# Patient Record
Sex: Female | Born: 1960 | Race: White | Hispanic: No | Smoking: Current every day smoker
Health system: Southern US, Community
[De-identification: ages and names within clinical notes are randomized; demographics above are authoritative.]

## PROBLEM LIST (undated history)

## (undated) DIAGNOSIS — R739 Hyperglycemia, unspecified: Secondary | ICD-10-CM

## (undated) DIAGNOSIS — G4700 Insomnia, unspecified: Secondary | ICD-10-CM

## (undated) DIAGNOSIS — F329 Major depressive disorder, single episode, unspecified: Secondary | ICD-10-CM

## (undated) DIAGNOSIS — E7800 Pure hypercholesterolemia, unspecified: Secondary | ICD-10-CM

## (undated) DIAGNOSIS — K5090 Crohn's disease, unspecified, without complications: Secondary | ICD-10-CM

## (undated) DIAGNOSIS — M5430 Sciatica, unspecified side: Secondary | ICD-10-CM

## (undated) DIAGNOSIS — K745 Biliary cirrhosis, unspecified: Secondary | ICD-10-CM

## (undated) DIAGNOSIS — H9190 Unspecified hearing loss, unspecified ear: Secondary | ICD-10-CM

## (undated) DIAGNOSIS — F411 Generalized anxiety disorder: Secondary | ICD-10-CM

## (undated) DIAGNOSIS — F900 Attention-deficit hyperactivity disorder, predominantly inattentive type: Secondary | ICD-10-CM

## (undated) DIAGNOSIS — I10 Essential (primary) hypertension: Secondary | ICD-10-CM

## (undated) DIAGNOSIS — R7303 Prediabetes: Secondary | ICD-10-CM

## (undated) DIAGNOSIS — K529 Noninfective gastroenteritis and colitis, unspecified: Secondary | ICD-10-CM

## (undated) DIAGNOSIS — E559 Vitamin D deficiency, unspecified: Secondary | ICD-10-CM

## (undated) DIAGNOSIS — Z720 Tobacco use: Secondary | ICD-10-CM

## (undated) DIAGNOSIS — K743 Primary biliary cirrhosis: Secondary | ICD-10-CM

## (undated) HISTORY — DX: Vitamin D deficiency, unspecified: E55.9

## (undated) HISTORY — DX: Insomnia, unspecified: G47.00

## (undated) HISTORY — DX: Sciatica, unspecified side: M54.30

## (undated) HISTORY — DX: Hyperglycemia, unspecified: R73.9

## (undated) HISTORY — DX: Unspecified hearing loss, unspecified ear: H91.90

## (undated) HISTORY — DX: Tobacco use: Z72.0

## (undated) HISTORY — DX: Noninfective gastroenteritis and colitis, unspecified: K52.9

## (undated) HISTORY — DX: Attention-deficit hyperactivity disorder, predominantly inattentive type: F90.0

## (undated) HISTORY — DX: Primary biliary cirrhosis: K74.3

## (undated) HISTORY — PX: KNEE SURGERY: SHX244

## (undated) HISTORY — DX: Generalized anxiety disorder: F41.1

## (undated) HISTORY — DX: Prediabetes: R73.03

## (undated) HISTORY — PX: TONSILLECTOMY: SUR1361

## (undated) HISTORY — DX: Major depressive disorder, single episode, unspecified: F32.9

## (undated) HISTORY — DX: Essential (primary) hypertension: I10

## (undated) HISTORY — DX: Pure hypercholesterolemia, unspecified: E78.00

---

## 2006-01-14 ENCOUNTER — Other Ambulatory Visit: Admission: RE | Admit: 2006-01-14 | Discharge: 2006-01-14 | Payer: Self-pay | Admitting: Internal Medicine

## 2007-03-20 ENCOUNTER — Other Ambulatory Visit: Admission: RE | Admit: 2007-03-20 | Discharge: 2007-03-20 | Payer: Self-pay | Admitting: Obstetrics and Gynecology

## 2009-09-18 ENCOUNTER — Encounter: Admission: RE | Admit: 2009-09-18 | Discharge: 2009-09-18 | Payer: Self-pay | Admitting: Family Medicine

## 2012-03-27 ENCOUNTER — Other Ambulatory Visit: Payer: Self-pay | Admitting: Family Medicine

## 2012-03-27 ENCOUNTER — Ambulatory Visit
Admission: RE | Admit: 2012-03-27 | Discharge: 2012-03-27 | Disposition: A | Discharge status: Home / Self Care | Payer: Medicare HMO | Source: Ambulatory Visit | Attending: Family Medicine | Admitting: Family Medicine

## 2012-03-27 DIAGNOSIS — M79609 Pain in unspecified limb: Secondary | ICD-10-CM

## 2012-09-10 ENCOUNTER — Observation Stay (HOSPITAL_COMMUNITY)
Admission: EM | Admit: 2012-09-10 | Discharge: 2012-09-11 | Disposition: A | Discharge status: Home / Self Care | Payer: Managed Care, Other (non HMO) | Attending: General Surgery | Admitting: General Surgery

## 2012-09-10 ENCOUNTER — Emergency Department (HOSPITAL_COMMUNITY): Payer: Managed Care, Other (non HMO) | Admitting: Certified Registered Nurse Anesthetist

## 2012-09-10 ENCOUNTER — Encounter (HOSPITAL_COMMUNITY)
Admission: EM | Disposition: A | Discharge status: Home / Self Care | Payer: Self-pay | Source: Home / Self Care | Attending: Emergency Medicine

## 2012-09-10 ENCOUNTER — Encounter (HOSPITAL_COMMUNITY): Payer: Self-pay | Admitting: Certified Registered Nurse Anesthetist

## 2012-09-10 ENCOUNTER — Encounter (HOSPITAL_COMMUNITY): Payer: Self-pay

## 2012-09-10 ENCOUNTER — Emergency Department (HOSPITAL_COMMUNITY): Payer: Managed Care, Other (non HMO)

## 2012-09-10 DIAGNOSIS — K3580 Unspecified acute appendicitis: Secondary | ICD-10-CM

## 2012-09-10 DIAGNOSIS — K745 Biliary cirrhosis, unspecified: Secondary | ICD-10-CM | POA: Insufficient documentation

## 2012-09-10 DIAGNOSIS — I10 Essential (primary) hypertension: Secondary | ICD-10-CM | POA: Insufficient documentation

## 2012-09-10 DIAGNOSIS — K37 Unspecified appendicitis: Secondary | ICD-10-CM

## 2012-09-10 DIAGNOSIS — E7800 Pure hypercholesterolemia, unspecified: Secondary | ICD-10-CM | POA: Insufficient documentation

## 2012-09-10 DIAGNOSIS — K5090 Crohn's disease, unspecified, without complications: Secondary | ICD-10-CM | POA: Insufficient documentation

## 2012-09-10 DIAGNOSIS — N390 Urinary tract infection, site not specified: Secondary | ICD-10-CM

## 2012-09-10 DIAGNOSIS — Z79899 Other long term (current) drug therapy: Secondary | ICD-10-CM | POA: Insufficient documentation

## 2012-09-10 HISTORY — DX: Crohn's disease, unspecified, without complications: K50.90

## 2012-09-10 HISTORY — DX: Biliary cirrhosis, unspecified: K74.5

## 2012-09-10 HISTORY — DX: Essential (primary) hypertension: I10

## 2012-09-10 HISTORY — DX: Pure hypercholesterolemia, unspecified: E78.00

## 2012-09-10 HISTORY — PX: LAPAROSCOPIC APPENDECTOMY: SHX408

## 2012-09-10 LAB — URINALYSIS, ROUTINE W REFLEX MICROSCOPIC
Bilirubin Urine: NEGATIVE
Glucose, UA: NEGATIVE mg/dL
Ketones, ur: NEGATIVE mg/dL
Nitrite: POSITIVE — AB
Protein, ur: NEGATIVE mg/dL
Specific Gravity, Urine: 1.018 (ref 1.005–1.030)
Urobilinogen, UA: 0.2 mg/dL (ref 0.0–1.0)
pH: 6 (ref 5.0–8.0)

## 2012-09-10 LAB — LIPASE, BLOOD: Lipase: 16 U/L (ref 11–59)

## 2012-09-10 LAB — COMPREHENSIVE METABOLIC PANEL
ALT: 14 U/L (ref 0–35)
AST: 18 U/L (ref 0–37)
Albumin: 3.7 g/dL (ref 3.5–5.2)
Alkaline Phosphatase: 144 U/L — ABNORMAL HIGH (ref 39–117)
BUN: 10 mg/dL (ref 6–23)
CO2: 25 mEq/L (ref 19–32)
Calcium: 9.7 mg/dL (ref 8.4–10.5)
Chloride: 101 mEq/L (ref 96–112)
Creatinine, Ser: 0.53 mg/dL (ref 0.50–1.10)
GFR calc Af Amer: >90 mL/min (ref >90)
GFR calc non Af Amer: >90 mL/min (ref >90)
Glucose, Bld: 113 mg/dL — ABNORMAL HIGH (ref 70–99)
Potassium: 3.9 mEq/L (ref 3.5–5.1)
Sodium: 136 mEq/L (ref 135–145)
Total Bilirubin: 0.4 mg/dL (ref 0.3–1.2)
Total Protein: 7.4 g/dL (ref 6.0–8.3)

## 2012-09-10 LAB — TYPE AND SCREEN
ABO/RH(D): A POS
Antibody Screen: NEGATIVE

## 2012-09-10 LAB — CBC WITH DIFFERENTIAL/PLATELET
Basophils Absolute: 0 10*3/uL (ref 0.0–0.1)
Basophils Relative: 0 % (ref 0–1)
Eosinophils Absolute: 0.3 10*3/uL (ref 0.0–0.7)
Eosinophils Relative: 2 % (ref 0–5)
HCT: 39.6 % (ref 36.0–46.0)
Hemoglobin: 13.6 g/dL (ref 12.0–15.0)
Lymphocytes Relative: 15 % (ref 12–46)
Lymphs Abs: 2.3 10*3/uL (ref 0.7–4.0)
MCH: 31.6 pg (ref 26.0–34.0)
MCHC: 34.3 g/dL (ref 30.0–36.0)
MCV: 92.1 fL (ref 78.0–100.0)
Monocytes Absolute: 0.8 10*3/uL (ref 0.1–1.0)
Monocytes Relative: 6 % (ref 3–12)
Neutro Abs: 11.3 10*3/uL — ABNORMAL HIGH (ref 1.7–7.7)
Neutrophils Relative %: 77 % (ref 43–77)
Platelets: 275 10*3/uL (ref 150–400)
RBC: 4.3 MIL/uL (ref 3.87–5.11)
RDW: 13.6 % (ref 11.5–15.5)
WBC: 14.8 10*3/uL — ABNORMAL HIGH (ref 4.0–10.5)

## 2012-09-10 LAB — URINE MICROSCOPIC-ADD ON

## 2012-09-10 LAB — APTT: aPTT: 28 seconds (ref 24–37)

## 2012-09-10 LAB — PROTIME-INR
INR: 0.97 (ref 0.00–1.49)
Prothrombin Time: 12.8 seconds (ref 11.6–15.2)

## 2012-09-10 LAB — ABO/RH: ABO/RH(D): A POS

## 2012-09-10 SURGERY — APPENDECTOMY, LAPAROSCOPIC
Anesthesia: General | Site: Abdomen | Wound class: Clean Contaminated

## 2012-09-10 MED ORDER — SUCCINYLCHOLINE CHLORIDE 20 MG/ML IJ SOLN
INTRAMUSCULAR | Status: DC | PRN
  Administered 2012-09-10: 100 mg via INTRAVENOUS

## 2012-09-10 MED ORDER — LIDOCAINE HCL (CARDIAC) 20 MG/ML IV SOLN
INTRAVENOUS | Status: DC | PRN
  Administered 2012-09-10: 50 mg via INTRAVENOUS

## 2012-09-10 MED ORDER — NEOSTIGMINE METHYLSULFATE 1 MG/ML IJ SOLN
INTRAMUSCULAR | Status: DC | PRN
  Administered 2012-09-10: 5 mg via INTRAVENOUS

## 2012-09-10 MED ORDER — LACTATED RINGERS IV SOLN: INTRAVENOUS | Status: DC

## 2012-09-10 MED ORDER — KCL IN DEXTROSE-NACL 20-5-0.45 MEQ/L-%-% IV SOLN
INTRAVENOUS | Status: AC
  Filled 2012-09-10: qty 1000

## 2012-09-10 MED ORDER — BUPIVACAINE-EPINEPHRINE 0.25% -1:200000 IJ SOLN
INTRAMUSCULAR | Status: DC | PRN
  Administered 2012-09-10: 20 mL

## 2012-09-10 MED ORDER — SODIUM CHLORIDE 0.9 % IV SOLN
1.0000 g | INTRAVENOUS | Status: DC
  Administered 2012-09-10 – 2012-09-11 (×2): 1 g via INTRAVENOUS
  Filled 2012-09-10: qty 1

## 2012-09-10 MED ORDER — ONDANSETRON HCL 4 MG/2ML IJ SOLN
4.0000 mg | Freq: Once | INTRAMUSCULAR | Status: AC
  Administered 2012-09-10: 4 mg via INTRAVENOUS
  Filled 2012-09-10: qty 2

## 2012-09-10 MED ORDER — MORPHINE SULFATE 2 MG/ML IJ SOLN
1.0000 mg | INTRAMUSCULAR | Status: DC | PRN
  Administered 2012-09-10 (×3): 1 mg via INTRAVENOUS
  Filled 2012-09-10 (×4): qty 1

## 2012-09-10 MED ORDER — NEBIVOLOL HCL 10 MG PO TABS
10.0000 mg | ORAL_TABLET | Freq: Every day | ORAL | Status: DC
  Administered 2012-09-10: 10 mg via ORAL
  Filled 2012-09-10 (×2): qty 1

## 2012-09-10 MED ORDER — ACETAMINOPHEN 10 MG/ML IV SOLN
INTRAVENOUS | Status: DC | PRN
  Administered 2012-09-10: 1000 mg via INTRAVENOUS

## 2012-09-10 MED ORDER — ONDANSETRON HCL 4 MG/2ML IJ SOLN
4.0000 mg | Freq: Four times a day (QID) | INTRAMUSCULAR | Status: DC | PRN
  Administered 2012-09-10: 4 mg via INTRAVENOUS
  Filled 2012-09-10: qty 2

## 2012-09-10 MED ORDER — FENTANYL CITRATE 0.05 MG/ML IJ SOLN: 25.0000 ug | INTRAMUSCULAR | Status: DC | PRN

## 2012-09-10 MED ORDER — HEPARIN SODIUM (PORCINE) 5000 UNIT/ML IJ SOLN
5000.0000 [IU] | Freq: Three times a day (TID) | INTRAMUSCULAR | Status: DC
  Administered 2012-09-10 (×2): 5000 [IU] via SUBCUTANEOUS
  Filled 2012-09-10 (×6): qty 1

## 2012-09-10 MED ORDER — MORPHINE SULFATE 4 MG/ML IJ SOLN
4.0000 mg | Freq: Once | INTRAMUSCULAR | Status: AC
  Administered 2012-09-10: 4 mg via INTRAVENOUS
  Filled 2012-09-10: qty 1

## 2012-09-10 MED ORDER — LACTATED RINGERS IV SOLN
INTRAVENOUS | Status: DC | PRN
  Administered 2012-09-10 (×3): via INTRAVENOUS

## 2012-09-10 MED ORDER — ATORVASTATIN CALCIUM 10 MG PO TABS
5.0000 mg | ORAL_TABLET | Freq: Every day | ORAL | Status: DC
  Administered 2012-09-10: 5 mg via ORAL
  Filled 2012-09-10 (×2): qty 0.5

## 2012-09-10 MED ORDER — SODIUM CHLORIDE 0.9 % IV BOLUS (SEPSIS)
1000.0000 mL | Freq: Once | INTRAVENOUS | Status: AC
  Administered 2012-09-10: 1000 mL via INTRAVENOUS

## 2012-09-10 MED ORDER — ALPRAZOLAM 0.5 MG PO TABS
0.5000 mg | ORAL_TABLET | Freq: Every evening | ORAL | Status: DC | PRN
  Administered 2012-09-10: 0.5 mg via ORAL
  Filled 2012-09-10: qty 1

## 2012-09-10 MED ORDER — HYDROMORPHONE HCL PF 1 MG/ML IJ SOLN
1.0000 mg | Freq: Once | INTRAMUSCULAR | Status: DC
  Filled 2012-09-10: qty 1

## 2012-09-10 MED ORDER — FENTANYL CITRATE 0.05 MG/ML IJ SOLN
INTRAMUSCULAR | Status: DC | PRN
  Administered 2012-09-10: 100 ug via INTRAVENOUS
  Administered 2012-09-10 (×2): 50 ug via INTRAVENOUS
  Administered 2012-09-10: 100 ug via INTRAVENOUS

## 2012-09-10 MED ORDER — OXYCODONE-ACETAMINOPHEN 5-325 MG PO TABS
1.0000 | ORAL_TABLET | ORAL | Status: DC | PRN
  Administered 2012-09-10 (×2): 1 via ORAL
  Administered 2012-09-11 (×2): 2 via ORAL
  Filled 2012-09-10: qty 2
  Filled 2012-09-10: qty 1
  Filled 2012-09-10: qty 2
  Filled 2012-09-10: qty 1

## 2012-09-10 MED ORDER — ERTAPENEM SODIUM 1 G IJ SOLR
INTRAMUSCULAR | Status: AC
  Filled 2012-09-10: qty 1

## 2012-09-10 MED ORDER — ACETAMINOPHEN 10 MG/ML IV SOLN
INTRAVENOUS | Status: AC
  Filled 2012-09-10: qty 100

## 2012-09-10 MED ORDER — PROPOFOL 10 MG/ML IV BOLUS
INTRAVENOUS | Status: DC | PRN
  Administered 2012-09-10: 200 mg via INTRAVENOUS

## 2012-09-10 MED ORDER — BUPIVACAINE-EPINEPHRINE PF 0.25-1:200000 % IJ SOLN
INTRAMUSCULAR | Status: AC
  Filled 2012-09-10: qty 30

## 2012-09-10 MED ORDER — ONDANSETRON HCL 4 MG/2ML IJ SOLN
INTRAMUSCULAR | Status: DC | PRN
  Administered 2012-09-10: 4 mg via INTRAVENOUS

## 2012-09-10 MED ORDER — DESVENLAFAXINE SUCCINATE ER 50 MG PO TB24
50.0000 mg | ORAL_TABLET | Freq: Every day | ORAL | Status: DC
Start: 2012-09-10 — End: 2012-09-11
  Administered 2012-09-10: 50 mg via ORAL
  Filled 2012-09-10 (×3): qty 1

## 2012-09-10 MED ORDER — GLYCOPYRROLATE 0.2 MG/ML IJ SOLN
INTRAMUSCULAR | Status: DC | PRN
  Administered 2012-09-10: 0.6 mg via INTRAVENOUS

## 2012-09-10 MED ORDER — ROCURONIUM BROMIDE 100 MG/10ML IV SOLN
INTRAVENOUS | Status: DC | PRN
  Administered 2012-09-10: 10 mg via INTRAVENOUS
  Administered 2012-09-10: 40 mg via INTRAVENOUS
  Administered 2012-09-10: 10 mg via INTRAVENOUS

## 2012-09-10 MED ORDER — KCL IN DEXTROSE-NACL 20-5-0.45 MEQ/L-%-% IV SOLN
INTRAVENOUS | Status: DC
  Administered 2012-09-10 (×2): via INTRAVENOUS
  Filled 2012-09-10 (×3): qty 1000

## 2012-09-10 MED ORDER — LACTATED RINGERS IR SOLN
Status: DC | PRN
  Administered 2012-09-10: 1

## 2012-09-10 MED ORDER — SODIUM CHLORIDE 0.9 % IV SOLN: INTRAVENOUS | Status: DC

## 2012-09-10 SURGICAL SUPPLY — 38 items
APPLIER CLIP ROT 10 11.4 M/L (STAPLE) ×2
BENZOIN TINCTURE PRP APPL 2/3 (GAUZE/BANDAGES/DRESSINGS) ×2 IMPLANT
CANISTER SUCTION 2500CC (MISCELLANEOUS) ×2 IMPLANT
CLIP APPLIE ROT 10 11.4 M/L (STAPLE) ×1 IMPLANT
CLOTH BEACON ORANGE TIMEOUT ST (SAFETY) ×2 IMPLANT
COVER SURGICAL LIGHT HANDLE (MISCELLANEOUS) ×2 IMPLANT
CUTTER FLEX LINEAR 45M (STAPLE) ×2 IMPLANT
DECANTER SPIKE VIAL GLASS SM (MISCELLANEOUS) ×2 IMPLANT
DERMABOND ADVANCED (GAUZE/BANDAGES/DRESSINGS) ×1
DERMABOND ADVANCED .7 DNX12 (GAUZE/BANDAGES/DRESSINGS) ×1 IMPLANT
DRAPE LAPAROSCOPIC ABDOMINAL (DRAPES) ×2 IMPLANT
ELECT REM PT RETURN 9FT ADLT (ELECTROSURGICAL) ×2
ELECTRODE REM PT RTRN 9FT ADLT (ELECTROSURGICAL) ×1 IMPLANT
ENDOLOOP SUT PDS II  0 18 (SUTURE) ×1
ENDOLOOP SUT PDS II 0 18 (SUTURE) ×1 IMPLANT
GLOVE BIOGEL M 8.0 STRL (GLOVE) ×2 IMPLANT
GLOVE BIOGEL PI IND STRL 7.0 (GLOVE) ×1 IMPLANT
GLOVE BIOGEL PI INDICATOR 7.0 (GLOVE) ×1
GOWN STRL NON-REIN LRG LVL3 (GOWN DISPOSABLE) ×2 IMPLANT
GOWN STRL REIN XL XLG (GOWN DISPOSABLE) ×4 IMPLANT
HAND ACTIVATED (MISCELLANEOUS) ×2 IMPLANT
KIT BASIN OR (CUSTOM PROCEDURE TRAY) ×2 IMPLANT
NS IRRIG 1000ML POUR BTL (IV SOLUTION) ×2 IMPLANT
PENCIL BUTTON HOLSTER BLD 10FT (ELECTRODE) ×2 IMPLANT
POUCH SPECIMEN RETRIEVAL 10MM (ENDOMECHANICALS) ×2 IMPLANT
RELOAD 45 VASCULAR/THIN (ENDOMECHANICALS) ×2 IMPLANT
RELOAD STAPLE TA45 3.5 REG BLU (ENDOMECHANICALS) IMPLANT
SET IRRIG TUBING LAPAROSCOPIC (IRRIGATION / IRRIGATOR) ×2 IMPLANT
SOLUTION ANTI FOG 6CC (MISCELLANEOUS) ×2 IMPLANT
STRIP CLOSURE SKIN 1/2X4 (GAUZE/BANDAGES/DRESSINGS) ×2 IMPLANT
SUT VIC AB 4-0 SH 18 (SUTURE) ×2 IMPLANT
SUT VICRYL 0 UR6 27IN ABS (SUTURE) ×2 IMPLANT
SYR 30ML LL (SYRINGE) ×2 IMPLANT
TRAY FOLEY CATH 14FRSI W/METER (CATHETERS) ×2 IMPLANT
TRAY LAP CHOLE (CUSTOM PROCEDURE TRAY) ×2 IMPLANT
TROCAR XCEL BLUNT TIP 100MML (ENDOMECHANICALS) ×2 IMPLANT
TROCAR XCEL NON-BLD 11X100MML (ENDOMECHANICALS) ×2 IMPLANT
TUBING INSUFFLATION 10FT LAP (TUBING) ×2 IMPLANT

## 2012-09-10 NOTE — ED Notes (Signed)
Patient is aware that we need a urine sample. Patient will use call bell when she is ready to urinate.

## 2012-09-10 NOTE — ED Provider Notes (Signed)
History     CSN: [id number]  Arrival date & time 09/10/12  4098   First MD Initiated Contact with Patient 09/10/12 0344      Chief Complaint  Patient presents with  . Flank Pain    (Consider location/radiation/quality/duration/timing/severity/associated sxs/prior treatment) HPI Comments: Patient is a 51 year old female with a past medical history of Crohn's disease and biliary cirrhosis who presents with a 1 day history of right flank pain. The pain is located in right flank and radiates to RUQ of abdomen. The pain is described as sharp and severe. The pain started gradually and progressively worsened since the onset. No alleviating/aggravating factors. The patient has tried nothing for symptoms without relief. Associated symptoms include nausea and vomiting. Patient denies fever, headache, diarrhea, chest pain, SOB, dysuria, constipation, abnormal vaginal bleeding/discharge.      Past Medical History  Diagnosis Date  . Hypertension   . Hypercholesteremia   . Crohn's disease   . Biliary cirrhosis     Past Surgical History  Procedure Date  . Knee surgery   . Tonsillectomy     History reviewed. No pertinent family history.  History  Substance Use Topics  . Smoking status: Current Some Day Smoker    Types: Cigarettes  . Smokeless tobacco: Not on file  . Alcohol Use: Yes     Comment: social    OB History    Grav Para Term Preterm Abortions TAB SAB Ect Mult Living                  Review of Systems  Gastrointestinal: Positive for nausea, vomiting and abdominal pain.  All other systems reviewed and are negative.    Allergies  Review of patient's allergies indicates no known allergies.  Home Medications   Current Outpatient Rx  Name  Route  Sig  Dispense  Refill  . ALPRAZOLAM 0.5 MG PO TABS   Oral   Take 0.5 mg by mouth at bedtime as needed. ANXIETY         . DESVENLAFAXINE SUCCINATE ER 50 MG PO TB24   Oral   Take 50 mg by mouth daily.         .  IBUPROFEN 200 MG PO TABS   Oral   Take 400 mg by mouth every 6 (six) hours as needed.         . NEBIVOLOL HCL 10 MG PO TABS   Oral   Take 10 mg by mouth daily.         Marland Kitchen ROSUVASTATIN CALCIUM 10 MG PO TABS   Oral   Take 10 mg by mouth daily.         Marland Kitchen URSODIOL 300 MG PO CAPS   Oral   Take 600 mg by mouth 2 (two) times daily. TAKE TWO TWICE DAILY         . VARENICLINE TARTRATE 0.5 MG PO TABS   Oral   Take 0.5 mg by mouth 2 (two) times daily.         Marland Kitchen ZOLPIDEM TARTRATE 5 MG PO TABS   Oral   Take 5 mg by mouth at bedtime as needed.           BP 135/90  Pulse 77  Temp 98.7 F (37.1 C) (Oral)  Resp 18  SpO2 97%  Physical Exam  Nursing note and vitals reviewed. Constitutional: She is oriented to person, place, and time. She appears well-developed and well-nourished. No distress.  HENT:  Head: Normocephalic and  atraumatic.  Eyes: Conjunctivae normal and EOM are normal.  Neck: Normal range of motion. Neck supple.  Cardiovascular: Normal rate and regular rhythm.  Exam reveals no gallop and no friction rub.   No murmur heard. Pulmonary/Chest: Effort normal and breath sounds normal. She has no wheezes. She has no rales. She exhibits no tenderness.  Abdominal: Soft. She exhibits no distension. There is tenderness. There is no rebound and no guarding.       Generalized tenderness to light palpation of abdomen most notable in RUQ.   Musculoskeletal: Normal range of motion.  Neurological: She is alert and oriented to person, place, and time. Coordination normal.       Speech is goal-oriented. Moves limbs without ataxia.   Skin: Skin is warm and dry. She is not diaphoretic.  Psychiatric: She has a normal mood and affect. Her behavior is normal.    ED Course  Procedures (including critical care time)  Labs Reviewed  URINALYSIS, ROUTINE W REFLEX MICROSCOPIC - Abnormal; Notable for the following:    APPearance CLOUDY (*)     Hgb urine dipstick SMALL (*)     Nitrite  POSITIVE (*)     Leukocytes, UA SMALL (*)     All other components within normal limits  CBC WITH DIFFERENTIAL - Abnormal; Notable for the following:    WBC 14.8 (*)     Neutro Abs 11.3 (*)     All other components within normal limits  COMPREHENSIVE METABOLIC PANEL - Abnormal; Notable for the following:    Glucose, Bld 113 (*)     Alkaline Phosphatase 144 (*)     All other components within normal limits  URINE MICROSCOPIC-ADD ON - Abnormal; Notable for the following:    Squamous Epithelial / LPF FEW (*)     Bacteria, UA MANY (*)     All other components within normal limits  LIPASE, BLOOD  URINE CULTURE  APTT  PROTIME-INR   Ct Abdomen Pelvis Wo Contrast  09/10/2012  *RADIOLOGY REPORT*  Clinical Data: Right flank pain.  CT ABDOMEN AND PELVIS WITHOUT CONTRAST  Technique:  Multidetector CT imaging of the abdomen and pelvis was performed following the standard protocol without intravenous contrast.  Comparison: None.  Findings: The lung bases are clear.  The kidneys appear symmetrical.  No pyelocaliectasis or ureterectasis.  No renal, ureteral, or bladder stones.  The unenhanced appearance of the liver, spleen, gallbladder, pancreas, adrenal glands, kidneys, and retroperitoneal lymph nodes is unremarkable.  Calcification of the abdominal aorta without aneurysm.  The stomach and small bowel are decompressed.  Stool filled colon without wall thickening or distension.  No free air or free fluid in the abdomen.  Pelvis:  Calcification in the right lower quadrant consistent with appendicolith.  Superior orientation of the retrocecal appendix. The appendix is distended and fluid-filled with periappendiceal infiltration consistent with acute appendicitis.  Appendiceal diameter measures up to 1.5 cm.  No focal abscess.  Uterus and ovaries are not enlarged.  Bladder is decompressed.  No diverticulitis.  No free or loculated pelvic fluid collections.  No significant pelvic lymphadenopathy.  Normal  alignment of the lumbar vertebrae with mild degenerative change.  IMPRESSION: Acute appendicitis with periappendiceal inflammation.  No abscess. Appendicolith.   Original Report Authenticated By: Burman Nieves, M.D.      1. Appendicitis   2. UTI (urinary tract infection)       MDM  4:18 AM Patient will have fluids, morphine, and zofran.   5:30 AM Patient feeling better.  Patient given dilaudid for more pain control. Patient's CT shows acute appendicitis. General surgery consulted. Patient NPO.         Emilia Beck, PA-C 09/10/12 [phone]

## 2012-09-10 NOTE — ED Notes (Signed)
Pt states woke up Sat am at 0300 with rt flank pain.  Nausea, no vomiting.  No diarrhea.  No difficulty urinating.

## 2012-09-10 NOTE — H&P (Signed)
Chief Complaint:  rlq pain and CT to show appendicitis  History of Present Illness:  Alicia Buchanan is an 51 y.o. female is seen in the ER with a 24 hr history of rlq abdominal pain consistent with appendicitis.  CT confirms.  Informed consent obtained in the ED.  Primary is Dr. Laurine Blazer at [facility] and Dr. Dulce Sellar is GI with dx of Crohn's by colonoscopy without symptoms and not treated.    Past Medical History  Diagnosis Date  . Hypertension   . Hypercholesteremia   . Crohn's disease   . Biliary cirrhosis     Past Surgical History  Procedure Date  . Knee surgery   . Tonsillectomy     Current Facility-Administered Medications  Medication Dose Route Frequency Provider Last Rate Last Dose  . 0.9 %  sodium chloride infusion   Intravenous Continuous Sunnie Nielsen, MD      . HYDROmorphone (DILAUDID) injection 1 mg  1 mg Intravenous Once [employer], PA-C      . [COMPLETED] morphine 4 MG/ML injection 4 mg  4 mg Intravenous Once [employer], PA-C   4 mg at 09/10/12 0443  . [COMPLETED] ondansetron (ZOFRAN) injection 4 mg  4 mg Intravenous Once [employer], PA-C   4 mg at 09/10/12 0442  . [COMPLETED] sodium chloride 0.9 % bolus 1,000 mL  1,000 mL Intravenous Once [facility], PA-C 1,000 mL/hr at 09/10/12 0444 1,000 mL at 09/10/12 0444   Current Outpatient Prescriptions  Medication Sig Dispense Refill  . ALPRAZolam (XANAX) 0.5 MG tablet Take 0.5 mg by mouth at bedtime as needed. ANXIETY      . desvenlafaxine (PRISTIQ) 50 MG 24 hr tablet Take 50 mg by mouth daily.      Marland Kitchen ibuprofen (ADVIL,MOTRIN) 200 MG tablet Take 400 mg by mouth every 6 (six) hours as needed.      . nebivolol (BYSTOLIC) 10 MG tablet Take 10 mg by mouth daily.      . rosuvastatin (CRESTOR) 10 MG tablet Take 10 mg by mouth daily.      . ursodiol (ACTIGALL) 300 MG capsule Take 600 mg by mouth 2 (two) times daily. TAKE TWO TWICE DAILY      . varenicline (CHANTIX) 0.5 MG tablet Take 0.5 mg by mouth 2  (two) times daily.      Marland Kitchen zolpidem (AMBIEN) 5 MG tablet Take 5 mg by mouth at bedtime as needed.       Review of patient's allergies indicates no known allergies. History reviewed. No pertinent family history. Social History:   reports that she has been smoking Cigarettes.  She does not have any smokeless tobacco history on file. She reports that she drinks alcohol. She reports that she does not use illicit drugs.   REVIEW OF SYSTEMS - PERTINENT POSITIVES ONLY: No history of DVT  Physical Exam:   Blood pressure 135/90, pulse 77, temperature 98.7 F (37.1 C), temperature source Oral, resp. rate 18, last menstrual period 10/18/2010, SpO2 97.00%. There is no height or weight on file to calculate BMI.  Gen:  WDWN WF NAD  Neurological: Alert and oriented to person, place, and time. Motor and sensory function is grossly intact  Head: Normocephalic and atraumatic.  Eyes: Conjunctivae are normal. Pupils are equal, round, and reactive to light. No scleral icterus.  Neck: Normal range of motion. Neck supple. No tracheal deviation or thyromegaly present.  Cardiovascular:  SR without murmurs or gallops.  No carotid bruits Respiratory: Effort normal.  No respiratory distress.  No chest wall tenderness. Breath sounds normal.  No wheezes, rales or rhonchi.  Abdomen:  RLQ abdominal pain GU: Musculoskeletal: Normal range of motion. Extremities are nontender. No cyanosis, edema or clubbing noted Lymphadenopathy: No cervical, preauricular, postauricular or axillary adenopathy is present Skin: Skin is warm and dry. No rash noted. No diaphoresis. No erythema. No pallor. Pscyh: Normal mood and affect. Behavior is normal. Judgment and thought content normal.   LABORATORY RESULTS: Results for orders placed during the hospital encounter of 09/10/12 (from the past 48 hour(s))  URINALYSIS, ROUTINE W REFLEX MICROSCOPIC     Status: Abnormal   Collection Time   09/10/12  4:00 AM      Component Value Range Comment    Color, Urine YELLOW  YELLOW    APPearance CLOUDY (*) CLEAR    Specific Gravity, Urine 1.018  1.005 - 1.030    pH 6.0  5.0 - 8.0    Glucose, UA NEGATIVE  NEGATIVE mg/dL    Hgb urine dipstick SMALL (*) NEGATIVE    Bilirubin Urine NEGATIVE  NEGATIVE    Ketones, ur NEGATIVE  NEGATIVE mg/dL    Protein, ur NEGATIVE  NEGATIVE mg/dL    Urobilinogen, UA 0.2  0.0 - 1.0 mg/dL    Nitrite POSITIVE (*) NEGATIVE    Leukocytes, UA SMALL (*) NEGATIVE   URINE MICROSCOPIC-ADD ON     Status: Abnormal   Collection Time   09/10/12  4:00 AM      Component Value Range Comment   Squamous Epithelial / LPF FEW (*) RARE    WBC, UA 7-10  <3 WBC/hpf    RBC / HPF 3-6  <3 RBC/hpf    Bacteria, UA MANY (*) RARE   CBC WITH DIFFERENTIAL     Status: Abnormal   Collection Time   09/10/12  4:38 AM      Component Value Range Comment   WBC 14.8 (*) 4.0 - 10.5 K/uL    RBC 4.30  3.87 - 5.11 MIL/uL    Hemoglobin 13.6  12.0 - 15.0 g/dL    HCT 04.5  40.9 - 81.1 %    MCV 92.1  78.0 - 100.0 fL    MCH 31.6  26.0 - 34.0 pg    MCHC 34.3  30.0 - 36.0 g/dL    RDW 91.4  78.2 - 95.6 %    Platelets 275  150 - 400 K/uL    Neutrophils Relative 77  43 - 77 %    Neutro Abs 11.3 (*) 1.7 - 7.7 K/uL    Lymphocytes Relative 15  12 - 46 %    Lymphs Abs 2.3  0.7 - 4.0 K/uL    Monocytes Relative 6  3 - 12 %    Monocytes Absolute 0.8  0.1 - 1.0 K/uL    Eosinophils Relative 2  0 - 5 %    Eosinophils Absolute 0.3  0.0 - 0.7 K/uL    Basophils Relative 0  0 - 1 %    Basophils Absolute 0.0  0.0 - 0.1 K/uL   COMPREHENSIVE METABOLIC PANEL     Status: Abnormal   Collection Time   09/10/12  4:38 AM      Component Value Range Comment   Sodium 136  135 - 145 mEq/L    Potassium 3.9  3.5 - 5.1 mEq/L    Chloride 101  96 - 112 mEq/L    CO2 25  19 - 32 mEq/L    Glucose, Bld 113 (*) 70 -  99 mg/dL    BUN 10  6 - 23 mg/dL    Creatinine, Ser 1.61  0.50 - 1.10 mg/dL    Calcium 9.7  8.4 - 09.6 mg/dL    Total Protein 7.4  6.0 - 8.3 g/dL    Albumin  3.7  3.5 - 5.2 g/dL    AST 18  0 - 37 U/L    ALT 14  0 - 35 U/L    Alkaline Phosphatase 144 (*) 39 - 117 U/L    Total Bilirubin 0.4  0.3 - 1.2 mg/dL    GFR calc non Af Amer >90  >90 mL/min    GFR calc Af Amer >90  >90 mL/min   LIPASE, BLOOD     Status: Normal   Collection Time   09/10/12  4:38 AM      Component Value Range Comment   Lipase 16  11 - 59 U/L   APTT     Status: Normal   Collection Time   09/10/12  6:10 AM      Component Value Range Comment   aPTT 28  24 - 37 seconds   PROTIME-INR     Status: Normal   Collection Time   09/10/12  6:10 AM      Component Value Range Comment   Prothrombin Time 12.8  11.6 - 15.2 seconds    INR 0.97  0.00 - 1.49   TYPE AND SCREEN     Status: Normal (Preliminary result)   Collection Time   09/10/12  6:10 AM      Component Value Range Comment   ABO/RH(D) A POS      Antibody Screen PENDING      Sample Expiration 09/13/2012       RADIOLOGY RESULTS: Ct Abdomen Pelvis Wo Contrast  09/10/2012  *RADIOLOGY REPORT*  Clinical Data: Right flank pain.  CT ABDOMEN AND PELVIS WITHOUT CONTRAST  Technique:  Multidetector CT imaging of the abdomen and pelvis was performed following the standard protocol without intravenous contrast.  Comparison: None.  Findings: The lung bases are clear.  The kidneys appear symmetrical.  No pyelocaliectasis or ureterectasis.  No renal, ureteral, or bladder stones.  The unenhanced appearance of the liver, spleen, gallbladder, pancreas, adrenal glands, kidneys, and retroperitoneal lymph nodes is unremarkable.  Calcification of the abdominal aorta without aneurysm.  The stomach and small bowel are decompressed.  Stool filled colon without wall thickening or distension.  No free air or free fluid in the abdomen.  Pelvis:  Calcification in the right lower quadrant consistent with appendicolith.  Superior orientation of the retrocecal appendix. The appendix is distended and fluid-filled with periappendiceal infiltration consistent  with acute appendicitis.  Appendiceal diameter measures up to 1.5 cm.  No focal abscess.  Uterus and ovaries are not enlarged.  Bladder is decompressed.  No diverticulitis.  No free or loculated pelvic fluid collections.  No significant pelvic lymphadenopathy.  Normal alignment of the lumbar vertebrae with mild degenerative change.  IMPRESSION: Acute appendicitis with periappendiceal inflammation.  No abscess. Appendicolith.   Original Report Authenticated By: Burman Nieves, M.D.     Problem List: There is no problem list on file for this patient.   Assessment & Plan: Acute appendicitis Lap appendectomy    Matt B. Daphine Deutscher, MD, [facility] [phone] beeper [phone]  09/10/2012 7:10 AM

## 2012-09-10 NOTE — Op Note (Signed)
Surgeon: Wenda Low, MD, FACS  Asst:  none  Anes:  Gen.  Procedure: Laparoscopic appendectomy  Diagnosis: Lateral extraperitoneal appendicitis  Complications: none  EBL:   Minimal  cc  Description of Procedure:  The patient was taken to [facility] or 11 on Sunday, 09/10/2012. Preoperatively she received 1 g of Invanz. After general anesthesia was administered she was prepped with PCMX and draped sterilely. A longitudinal incision was made in the umbilicus and into a small hernia I went ahead and opened and inserted the [facility] cannula. A 5 mm was placed in the right upper quadrant and another 11 was placed laterally obliquely on the left lower quadrant.  I went over and began my dissection and saw no evidence appendix. Cecum was free well sealed and the terminal ileum was lying over top The sidewall.  I first took it down with sharp dissection and also separated from where the ovary was stuck up to it. I examined the terminal ileum going back several feet and did not see any evidence of creeping fat. Then made my diagnosis of Crohn's seen somewhat questionable. I then mobilized the cecum laterally and found a very large chronically inflamed appendix along with the descending colon sidewall. I mobilized it) Endoloop about the tip because it was too sick from either manage. I used a harmonic scalpel to complete the mobilization of the colon so I could reveal its base which had her fairly normal appearance. This would go along with the appendicolith was down in the midportion causing an distal changes. He is a harmonic to isolate the base and then used a standard white load Endo GIA to placed across the base holding it for 30 seconds to allow compression before firing. He was then removed and placed in a bag. I inspected the appendiceal stump and it appeared to be secure and dry. No bleeding was noted there. I then removed the appendix to the umbilicus and then repaired the umbilical defect with laparoscopic  visualization with 3 simple sutures of 0 Vicryl. Port sites from injected with quarter percent Marcaine. I then went back and reinspected and irrigated everything looked to be in order. The abdomen was deflated and the trochars removed. Wounds were closed 4-0 Vicryl subcuticularly and with Dermabond. Patient are the procedure well taken recovery in satisfactory condition. Matt B. Daphine Deutscher, MD, [facility], [place] [phone]

## 2012-09-10 NOTE — Anesthesia Preprocedure Evaluation (Signed)
Anesthesia Evaluation  Patient identified by MRN, date of birth, ID band Patient awake    Reviewed: Allergy & Precautions, H&P , NPO status , Patient's Chart, lab work & pertinent test results, reviewed documented beta blocker date and time   Airway Mallampati: II TM Distance: >3 FB Neck ROM: full    Dental No notable dental hx. (+) Teeth Intact and Dental Advisory Given   Pulmonary Current Smoker,  breath sounds clear to auscultation  Pulmonary exam normal       Cardiovascular Exercise Tolerance: Good hypertension, Pt. on medications and On Home Beta Blockers Rhythm:regular Rate:Normal     Neuro/Psych negative neurological ROS  negative psych ROS   GI/Hepatic negative GI ROS, Neg liver ROS, Biliary cirrhosis   Endo/Other  negative endocrine ROS  Renal/GU negative Renal ROS  negative genitourinary   Musculoskeletal   Abdominal   Peds  Hematology negative hematology ROS (+)   Anesthesia Other Findings   Reproductive/Obstetrics negative OB ROS                           Anesthesia Physical Anesthesia Plan  ASA: II  Anesthesia Plan: General   Post-op Pain Management:    Induction: Intravenous  Airway Management Planned: Oral ETT  Additional Equipment:   Intra-op Plan:   Post-operative Plan: Extubation in OR  Informed Consent: I have reviewed the patients History and Physical, chart, labs and discussed the procedure including the risks, benefits and alternatives for the proposed anesthesia with the patient or authorized representative who has indicated his/her understanding and acceptance.   Dental Advisory Given  Plan Discussed with: CRNA and Surgeon  Anesthesia Plan Comments:         Anesthesia Quick Evaluation

## 2012-09-10 NOTE — Anesthesia Postprocedure Evaluation (Signed)
  Anesthesia Post-op Note  Patient: Alicia Buchanan  Procedure(s) Performed: Procedure(s) (LRB): APPENDECTOMY LAPAROSCOPIC (N/A)  Patient Location: PACU  Anesthesia Type: General  Level of Consciousness: awake and alert   Airway and Oxygen Therapy: Patient Spontanous Breathing  Post-op Pain: mild  Post-op Assessment: Post-op Vital signs reviewed, Patient's Cardiovascular Status Stable, Respiratory Function Stable, Patent Airway and No signs of Nausea or vomiting  Last Vitals:  Filed Vitals:   09/10/12 1050  BP:   Pulse: 48  Temp:   Resp: 13    Post-op Vital Signs: stable   Complications: No apparent anesthesia complications

## 2012-09-10 NOTE — Progress Notes (Signed)
pacu note:  One bag of personal belongings taken to patients room 1520.  No valuables.  5 Lexicographer aware.

## 2012-09-10 NOTE — Transfer of Care (Signed)
Immediate Anesthesia Transfer of Care Note  Patient: Alicia Buchanan  Procedure(s) Performed: Procedure(s) (LRB) with comments: APPENDECTOMY LAPAROSCOPIC (N/A)  Patient Location: PACU  Anesthesia Type:General  Level of Consciousness: awake and alert   Airway & Oxygen Therapy: Patient Spontanous Breathing and Patient connected to face mask oxygen  Post-op Assessment: Report given to PACU RN and Post -op Vital signs reviewed and stable  Post vital signs: Reviewed and stable  Complications: No apparent anesthesia complications

## 2012-09-10 NOTE — ED Provider Notes (Signed)
Medical screening examination/treatment/procedure(s) were conducted as a shared visit with non-physician practitioner(s) and myself.  I personally evaluated the patient during the encounter.  Right sided abdominal pain. Tender to palpate the area the right flank with voluntary guarding. No ascites. Abdomen soft. Evaluated with CT scan and labs as below.  Results for orders placed during the hospital encounter of 09/10/12  URINALYSIS, ROUTINE W REFLEX MICROSCOPIC      Component Value Range   Color, Urine YELLOW  YELLOW   APPearance CLOUDY (*) CLEAR   Specific Gravity, Urine 1.018  1.005 - 1.030   pH 6.0  5.0 - 8.0   Glucose, UA NEGATIVE  NEGATIVE mg/dL   Hgb urine dipstick SMALL (*) NEGATIVE   Bilirubin Urine NEGATIVE  NEGATIVE   Ketones, ur NEGATIVE  NEGATIVE mg/dL   Protein, ur NEGATIVE  NEGATIVE mg/dL   Urobilinogen, UA 0.2  0.0 - 1.0 mg/dL   Nitrite POSITIVE (*) NEGATIVE   Leukocytes, UA SMALL (*) NEGATIVE  CBC WITH DIFFERENTIAL      Component Value Range   WBC 14.8 (*) 4.0 - 10.5 K/uL   RBC 4.30  3.87 - 5.11 MIL/uL   Hemoglobin 13.6  12.0 - 15.0 g/dL   HCT 78.2  95.6 - 21.3 %   MCV 92.1  78.0 - 100.0 fL   MCH 31.6  26.0 - 34.0 pg   MCHC 34.3  30.0 - 36.0 g/dL   RDW 08.6  57.8 - 46.9 %   Platelets 275  150 - 400 K/uL   Neutrophils Relative 77  43 - 77 %   Neutro Abs 11.3 (*) 1.7 - 7.7 K/uL   Lymphocytes Relative 15  12 - 46 %   Lymphs Abs 2.3  0.7 - 4.0 K/uL   Monocytes Relative 6  3 - 12 %   Monocytes Absolute 0.8  0.1 - 1.0 K/uL   Eosinophils Relative 2  0 - 5 %   Eosinophils Absolute 0.3  0.0 - 0.7 K/uL   Basophils Relative 0  0 - 1 %   Basophils Absolute 0.0  0.0 - 0.1 K/uL  COMPREHENSIVE METABOLIC PANEL      Component Value Range   Sodium 136  135 - 145 mEq/L   Potassium 3.9  3.5 - 5.1 mEq/L   Chloride 101  96 - 112 mEq/L   CO2 25  19 - 32 mEq/L   Glucose, Bld 113 (*) 70 - 99 mg/dL   BUN 10  6 - 23 mg/dL   Creatinine, Ser 6.29  0.50 - 1.10 mg/dL   Calcium 9.7   8.4 - 52.8 mg/dL   Total Protein 7.4  6.0 - 8.3 g/dL   Albumin 3.7  3.5 - 5.2 g/dL   AST 18  0 - 37 U/L   ALT 14  0 - 35 U/L   Alkaline Phosphatase 144 (*) 39 - 117 U/L   Total Bilirubin 0.4  0.3 - 1.2 mg/dL   GFR calc non Af Amer >90  >90 mL/min   GFR calc Af Amer >90  >90 mL/min  LIPASE, BLOOD      Component Value Range   Lipase 16  11 - 59 U/L  URINE MICROSCOPIC-ADD ON      Component Value Range   Squamous Epithelial / LPF FEW (*) RARE   WBC, UA 7-10  <3 WBC/hpf   RBC / HPF 3-6  <3 RBC/hpf   Bacteria, UA MANY (*) RARE   Ct Abdomen Pelvis Wo Contrast  09/10/2012  *RADIOLOGY REPORT*  Clinical Data: Right flank pain.  CT ABDOMEN AND PELVIS WITHOUT CONTRAST  Technique:  Multidetector CT imaging of the abdomen and pelvis was performed following the standard protocol without intravenous contrast.  Comparison: None.  Findings: The lung bases are clear.  The kidneys appear symmetrical.  No pyelocaliectasis or ureterectasis.  No renal, ureteral, or bladder stones.  The unenhanced appearance of the liver, spleen, gallbladder, pancreas, adrenal glands, kidneys, and retroperitoneal lymph nodes is unremarkable.  Calcification of the abdominal aorta without aneurysm.  The stomach and small bowel are decompressed.  Stool filled colon without wall thickening or distension.  No free air or free fluid in the abdomen.  Pelvis:  Calcification in the right lower quadrant consistent with appendicolith.  Superior orientation of the retrocecal appendix. The appendix is distended and fluid-filled with periappendiceal infiltration consistent with acute appendicitis.  Appendiceal diameter measures up to 1.5 cm.  No focal abscess.  Uterus and ovaries are not enlarged.  Bladder is decompressed.  No diverticulitis.  No free or loculated pelvic fluid collections.  No significant pelvic lymphadenopathy.  Normal alignment of the lumbar vertebrae with mild degenerative change.  IMPRESSION: Acute appendicitis with  periappendiceal inflammation.  No abscess. Appendicolith.   Original Report Authenticated By: Burman Nieves, M.D.     5:37 AM case discussed as above with Dr. Daphine Deutscher on call for general surgery. I have added on PT/PTT, keep patient n.p.o. and provide normal saline maintenance fluids. Urinalysis noted and patient has no UTI symptoms. Plan admit acute appendicitis  Sunnie Nielsen, MD 09/10/12 [phone]

## 2012-09-10 NOTE — ED Notes (Signed)
Patient is alert and oriented x3.  She is complaining of right sided pain that started  Yesterday morning at 10 am.  She states that the pain started small and has  Worsened through the day.  She denies any history of this issue.  She currently  Rates her pain at 8 of 10 and has nausea

## 2012-09-11 ENCOUNTER — Encounter (HOSPITAL_COMMUNITY): Payer: Self-pay | Admitting: Surgery

## 2012-09-11 MED ORDER — OXYCODONE-ACETAMINOPHEN 5-325 MG PO TABS: 1.0000 | ORAL_TABLET | ORAL | Status: DC | PRN

## 2012-09-11 NOTE — Progress Notes (Signed)
1 Day Post-Op  Subjective: Patient states that she is sore this morning from surgery, but otherwise no c/o.  Has been tolerating regular diet, only one episode of brief nausea last night. None since. Pain is well controlled with oral meds.  Objective: Vital signs in last 24 hours: Temp:  [97.6 F (36.4 C)-98.2 F (36.8 C)] 98 F (36.7 C) (11/25 0600) Pulse Rate:  [46-70] 60  (11/25 0600) Resp:  [12-20] 20  (11/25 0600) BP: (107-145)/(69-83) 107/69 mmHg (11/25 0600) SpO2:  [95 %-100 %] 96 % (11/25 0600) Weight:  [208 lb (94.348 kg)] 208 lb (94.348 kg) (11/25 0600) Last BM Date: 09/09/12  Intake/Output from previous day: 11/24 0701 - 11/25 0700 In: 2440 [P.O.:240; I.V.:2200] Out: 750 [Urine:750] Intake/Output this shift:    General appearance: alert, cooperative, appears stated age and no distress Chest: CTA Cardiac: RRR Abdomen: slightly distended, soft, tender over surgical wound sites. + BS, no flatus per patient, has been tolerating regular diet w/o issues. Extremities: warm to touch, no edema or tenderness. VSS, afebrile Lab Results:   [facility] 09/10/12 0438  WBC 14.8*  HGB 13.6  HCT 39.6  PLT 275   BMET  Basename 09/10/12 0438  NA 136  K 3.9  CL 101  CO2 25  GLUCOSE 113*  BUN 10  CREATININE 0.53  CALCIUM 9.7   PT/INR  Basename 09/10/12 0610  LABPROT 12.8  INR 0.97   ABG No results found for this basename: PHART:2,PCO2:2,PO2:2,HCO3:2 in the last 72 hours  Studies/Results: Ct Abdomen Pelvis Wo Contrast  09/10/2012  *RADIOLOGY REPORT*  Clinical Data: Right flank pain.  CT ABDOMEN AND PELVIS WITHOUT CONTRAST  Technique:  Multidetector CT imaging of the abdomen and pelvis was performed following the standard protocol without intravenous contrast.  Comparison: None.  Findings: The lung bases are clear.  The kidneys appear symmetrical.  No pyelocaliectasis or ureterectasis.  No renal, ureteral, or bladder stones.  The unenhanced appearance of the liver,  spleen, gallbladder, pancreas, adrenal glands, kidneys, and retroperitoneal lymph nodes is unremarkable.  Calcification of the abdominal aorta without aneurysm.  The stomach and small bowel are decompressed.  Stool filled colon without wall thickening or distension.  No free air or free fluid in the abdomen.  Pelvis:  Calcification in the right lower quadrant consistent with appendicolith.  Superior orientation of the retrocecal appendix. The appendix is distended and fluid-filled with periappendiceal infiltration consistent with acute appendicitis.  Appendiceal diameter measures up to 1.5 cm.  No focal abscess.  Uterus and ovaries are not enlarged.  Bladder is decompressed.  No diverticulitis.  No free or loculated pelvic fluid collections.  No significant pelvic lymphadenopathy.  Normal alignment of the lumbar vertebrae with mild degenerative change.  IMPRESSION: Acute appendicitis with periappendiceal inflammation.  No abscess. Appendicolith.   Original Report Authenticated By: Burman Nieves, M.D.     Anti-infectives: Anti-infectives     Start     Dose/Rate Route Frequency Ordered Stop   09/10/12 0730   ertapenem (INVANZ) 1 g in sodium chloride 0.9 % 50 mL IVPB  Status:  Discontinued        1 g 100 mL/hr over 30 Minutes Intravenous Every 24 hours 09/10/12 0713 09/11/12 0759          Assessment/Plan:  There is no problem list on file for this patient. s/p Procedure(s) (LRB) with comments: APPENDECTOMY LAPAROSCOPIC (N/A)  Plan: 1. Encourage OOB/ambulation 2. Probable discharge this afternoon if patient continues to do well with diet.  LOS: 1 day    Vegas Coffin 09/11/2012

## 2012-09-11 NOTE — Progress Notes (Signed)
Patient seen and examined.  She tolerated her regular diet well last night.  I feel she can be discharged.  Discharge instructions were given to her.

## 2012-09-11 NOTE — Progress Notes (Signed)
Discharge instructions given to pt, verbalized understanding. Left the unit in stable condition. 

## 2012-09-12 ENCOUNTER — Telehealth (INDEPENDENT_AMBULATORY_CARE_PROVIDER_SITE_OTHER): Payer: Self-pay

## 2012-09-12 LAB — URINE CULTURE: Colony Count: >=100000

## 2012-09-12 MED ORDER — CIPROFLOXACIN HCL 500 MG PO TABS: 500.0000 mg | ORAL_TABLET | Freq: Two times a day (BID) | ORAL | Status: AC

## 2012-09-12 NOTE — Telephone Encounter (Signed)
Dr Abbey Chatters called and had reviewed her urine results for Dr Daphine Deutscher.  He said it showed a uti.  He wants me to let her know and call in Cipro 500mg  1 po q 12 hours #20 with no refills.  I notified the pt and called in the Rx to [facility] [phone].

## 2012-09-13 ENCOUNTER — Telehealth (INDEPENDENT_AMBULATORY_CARE_PROVIDER_SITE_OTHER): Payer: Self-pay

## 2012-09-13 NOTE — Telephone Encounter (Signed)
Spoke with pt letting her know that her appointment has been scheduled for 12/13 @ 12p.

## 2012-09-19 NOTE — Discharge Summary (Signed)
Physician Discharge Summary  Patient ID: Alicia Buchanan MRN: [id number] DOB/AGE: January 04, 1961 51 y.o.  Admit date: 09/10/2012 Discharge date: 09/11/2012  Admission Diagnoses:  Acute appendicitis  Discharge Diagnoses:  Acute appendicitis E. Coli UTI    Discharged Condition: good  Hospital Course: She underwent a laparoscopic appendectomy by Dr. Daphine Deutscher on 09/10/2012 and tolerated this well.  She had a urine culture sent by the EDP which came back positive for an E.Coli UTI after she was discharged.  Cipro was called in for her for this. She had no postop problems and was discharged on POD  #1.  Discharge instructions were given to her.  Consults: None  Significant Diagnostic Studies: none  Treatments: surgery: Laparoscopic appendectomy  Discharge Exam: Blood pressure 107/69, pulse 60, temperature 98 F (36.7 C), temperature source Oral, resp. rate 20, height 5\' 5"  (1.651 m), weight 208 lb (94.348 kg), last menstrual period 10/18/2010, SpO2 96.00%.   Disposition: 01-Home or Self Care  Discharge Orders    Future Appointments: Provider: Department: Dept Phone: Center:   09/29/2012 12:00 PM Valarie Merino, MD [facility], [place] [phone] None       Medication List     As of 09/19/2012  8:19 AM    TAKE these medications         ALPRAZolam 0.5 MG tablet   Commonly known as: XANAX   Take 0.5 mg by mouth at bedtime as needed. ANXIETY      desvenlafaxine 50 MG 24 hr tablet   Commonly known as: PRISTIQ   Take 50 mg by mouth daily.      ibuprofen 200 MG tablet   Commonly known as: ADVIL,MOTRIN   Take 400 mg by mouth every 6 (six) hours as needed.      nebivolol 10 MG tablet   Commonly known as: BYSTOLIC   Take 10 mg by mouth daily.      oxyCODONE-acetaminophen 5-325 MG per tablet   Commonly known as: PERCOCET/ROXICET   Take 1-2 tablets by mouth every 4 (four) hours as needed.      rosuvastatin 10 MG tablet   Commonly known as: CRESTOR   Take 10 mg by  mouth daily.      ursodiol 300 MG capsule   Commonly known as: ACTIGALL   Take 600 mg by mouth 2 (two) times daily. TAKE TWO TWICE DAILY      varenicline 0.5 MG tablet   Commonly known as: CHANTIX   Take 0.5 mg by mouth 2 (two) times daily.      zolpidem 5 MG tablet   Commonly known as: AMBIEN   Take 5 mg by mouth at bedtime as needed.         Signed: Adolph Pollack 09/19/2012, 8:19 AM

## 2012-09-24 ENCOUNTER — Emergency Department (HOSPITAL_COMMUNITY)
Admission: EM | Admit: 2012-09-24 | Discharge: 2012-09-24 | Disposition: A | Discharge status: Home / Self Care | Payer: Medicare HMO | Attending: Emergency Medicine | Admitting: Emergency Medicine

## 2012-09-24 ENCOUNTER — Encounter (HOSPITAL_COMMUNITY): Payer: Self-pay

## 2012-09-24 ENCOUNTER — Emergency Department (HOSPITAL_COMMUNITY): Payer: Medicare HMO

## 2012-09-24 DIAGNOSIS — F17200 Nicotine dependence, unspecified, uncomplicated: Secondary | ICD-10-CM | POA: Insufficient documentation

## 2012-09-24 DIAGNOSIS — Y939 Activity, unspecified: Secondary | ICD-10-CM | POA: Insufficient documentation

## 2012-09-24 DIAGNOSIS — IMO0002 Reserved for concepts with insufficient information to code with codable children: Secondary | ICD-10-CM | POA: Insufficient documentation

## 2012-09-24 DIAGNOSIS — E7800 Pure hypercholesterolemia, unspecified: Secondary | ICD-10-CM | POA: Insufficient documentation

## 2012-09-24 DIAGNOSIS — S99929A Unspecified injury of unspecified foot, initial encounter: Secondary | ICD-10-CM

## 2012-09-24 DIAGNOSIS — Z79899 Other long term (current) drug therapy: Secondary | ICD-10-CM | POA: Insufficient documentation

## 2012-09-24 DIAGNOSIS — I10 Essential (primary) hypertension: Secondary | ICD-10-CM | POA: Insufficient documentation

## 2012-09-24 DIAGNOSIS — Y929 Unspecified place or not applicable: Secondary | ICD-10-CM | POA: Insufficient documentation

## 2012-09-24 DIAGNOSIS — S8990XA Unspecified injury of unspecified lower leg, initial encounter: Secondary | ICD-10-CM | POA: Insufficient documentation

## 2012-09-24 DIAGNOSIS — Z8719 Personal history of other diseases of the digestive system: Secondary | ICD-10-CM | POA: Insufficient documentation

## 2012-09-24 MED ORDER — OXYCODONE-ACETAMINOPHEN 5-325 MG PO TABS
1.0000 | ORAL_TABLET | Freq: Once | ORAL | Status: AC
  Administered 2012-09-24: 1 via ORAL
  Filled 2012-09-24: qty 1

## 2012-09-24 MED ORDER — IBUPROFEN 800 MG PO TABS
800.0000 mg | ORAL_TABLET | Freq: Once | ORAL | Status: AC
  Administered 2012-09-24: 800 mg via ORAL
  Filled 2012-09-24: qty 1

## 2012-09-24 MED ORDER — OXYCODONE-ACETAMINOPHEN 5-325 MG PO TABS: 2.0000 | ORAL_TABLET | ORAL | Status: AC | PRN

## 2012-09-24 NOTE — ED Notes (Signed)
[id number]<HQ> Expected date:<BR> Expected time:<BR> Means of arrival:<BR> Comments:<BR> Cleaning in progress.
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
## 2012-09-25 NOTE — ED Provider Notes (Signed)
Medical screening examination/treatment/procedure(s) were performed by non-physician practitioner and as supervising physician I was immediately available for consultation/collaboration.    Nelia Shi, MD 09/25/12 [phone]

## 2012-09-29 ENCOUNTER — Encounter (INDEPENDENT_AMBULATORY_CARE_PROVIDER_SITE_OTHER): Payer: Medicare HMO | Admitting: Surgery

## 2013-05-31 ENCOUNTER — Ambulatory Visit: Payer: Medicare HMO | Admitting: Psychology

## 2013-06-14 ENCOUNTER — Ambulatory Visit: Payer: Medicare HMO | Admitting: Psychology

## 2013-07-05 ENCOUNTER — Ambulatory Visit: Payer: Medicare HMO | Admitting: Psychology

## 2015-03-27 ENCOUNTER — Other Ambulatory Visit: Payer: Self-pay | Admitting: Gastroenterology

## 2015-03-27 DIAGNOSIS — K745 Biliary cirrhosis, unspecified: Secondary | ICD-10-CM

## 2015-03-27 DIAGNOSIS — R7989 Other specified abnormal findings of blood chemistry: Secondary | ICD-10-CM

## 2015-03-27 DIAGNOSIS — R945 Abnormal results of liver function studies: Principal | ICD-10-CM

## 2015-04-03 ENCOUNTER — Ambulatory Visit
Admission: RE | Admit: 2015-04-03 | Discharge: 2015-04-03 | Disposition: A | Discharge status: Home / Self Care | Payer: BLUE CROSS/BLUE SHIELD | Source: Ambulatory Visit | Attending: Gastroenterology | Admitting: Gastroenterology

## 2015-04-03 DIAGNOSIS — R945 Abnormal results of liver function studies: Principal | ICD-10-CM

## 2015-04-03 DIAGNOSIS — R7989 Other specified abnormal findings of blood chemistry: Secondary | ICD-10-CM

## 2015-04-03 DIAGNOSIS — K745 Biliary cirrhosis, unspecified: Secondary | ICD-10-CM

## 2017-01-26 ENCOUNTER — Other Ambulatory Visit: Payer: Self-pay | Admitting: Family Medicine

## 2017-01-26 DIAGNOSIS — Z1231 Encounter for screening mammogram for malignant neoplasm of breast: Secondary | ICD-10-CM

## 2017-02-15 ENCOUNTER — Ambulatory Visit
Admission: RE | Admit: 2017-02-15 | Discharge: 2017-02-15 | Disposition: A | Discharge status: Home / Self Care | Payer: BLUE CROSS/BLUE SHIELD | Source: Ambulatory Visit | Attending: Family Medicine | Admitting: Family Medicine

## 2017-02-15 DIAGNOSIS — Z1231 Encounter for screening mammogram for malignant neoplasm of breast: Secondary | ICD-10-CM

## 2017-02-25 DIAGNOSIS — H6123 Impacted cerumen, bilateral: Secondary | ICD-10-CM | POA: Diagnosis not present

## 2017-02-25 DIAGNOSIS — F17210 Nicotine dependence, cigarettes, uncomplicated: Secondary | ICD-10-CM | POA: Diagnosis not present

## 2017-02-25 DIAGNOSIS — H6063 Unspecified chronic otitis externa, bilateral: Secondary | ICD-10-CM

## 2017-02-25 DIAGNOSIS — H6243 Otitis externa in other diseases classified elsewhere, bilateral: Secondary | ICD-10-CM | POA: Diagnosis not present

## 2017-02-25 DIAGNOSIS — B369 Superficial mycosis, unspecified: Secondary | ICD-10-CM | POA: Diagnosis not present

## 2017-02-25 DIAGNOSIS — H93293 Other abnormal auditory perceptions, bilateral: Secondary | ICD-10-CM | POA: Diagnosis not present

## 2017-02-25 HISTORY — DX: Unspecified chronic otitis externa, bilateral: H60.63

## 2017-03-07 DIAGNOSIS — F900 Attention-deficit hyperactivity disorder, predominantly inattentive type: Secondary | ICD-10-CM | POA: Diagnosis not present

## 2017-03-07 DIAGNOSIS — J209 Acute bronchitis, unspecified: Secondary | ICD-10-CM | POA: Diagnosis not present

## 2017-09-07 DIAGNOSIS — G4700 Insomnia, unspecified: Secondary | ICD-10-CM | POA: Diagnosis not present

## 2017-09-07 DIAGNOSIS — Z23 Encounter for immunization: Secondary | ICD-10-CM | POA: Diagnosis not present

## 2017-09-07 DIAGNOSIS — F900 Attention-deficit hyperactivity disorder, predominantly inattentive type: Secondary | ICD-10-CM | POA: Diagnosis not present

## 2018-03-03 DIAGNOSIS — M7710 Lateral epicondylitis, unspecified elbow: Secondary | ICD-10-CM

## 2018-03-03 DIAGNOSIS — M25522 Pain in left elbow: Secondary | ICD-10-CM

## 2018-03-03 HISTORY — DX: Lateral epicondylitis, unspecified elbow: M77.10

## 2018-03-03 HISTORY — DX: Pain in left elbow: M25.522

## 2018-06-13 ENCOUNTER — Other Ambulatory Visit: Payer: Self-pay | Admitting: Family Medicine

## 2018-06-13 DIAGNOSIS — Z1231 Encounter for screening mammogram for malignant neoplasm of breast: Secondary | ICD-10-CM

## 2018-07-07 ENCOUNTER — Ambulatory Visit
Admission: RE | Admit: 2018-07-07 | Discharge: 2018-07-07 | Disposition: A | Discharge status: Home / Self Care | Payer: Managed Care, Other (non HMO) | Source: Ambulatory Visit | Attending: Family Medicine | Admitting: Family Medicine

## 2018-07-07 DIAGNOSIS — Z1231 Encounter for screening mammogram for malignant neoplasm of breast: Secondary | ICD-10-CM

## 2018-07-10 ENCOUNTER — Other Ambulatory Visit: Payer: Self-pay | Admitting: Family Medicine

## 2018-07-10 DIAGNOSIS — R928 Other abnormal and inconclusive findings on diagnostic imaging of breast: Secondary | ICD-10-CM

## 2018-07-12 ENCOUNTER — Ambulatory Visit
Admission: RE | Admit: 2018-07-12 | Discharge: 2018-07-12 | Disposition: A | Discharge status: Home / Self Care | Payer: Managed Care, Other (non HMO) | Source: Ambulatory Visit | Attending: Family Medicine | Admitting: Family Medicine

## 2018-07-12 ENCOUNTER — Ambulatory Visit: Payer: Managed Care, Other (non HMO)

## 2018-07-12 DIAGNOSIS — R928 Other abnormal and inconclusive findings on diagnostic imaging of breast: Secondary | ICD-10-CM

## 2018-11-06 DIAGNOSIS — M7712 Lateral epicondylitis, left elbow: Secondary | ICD-10-CM | POA: Diagnosis not present

## 2019-02-19 DIAGNOSIS — F411 Generalized anxiety disorder: Secondary | ICD-10-CM | POA: Diagnosis not present

## 2019-02-19 DIAGNOSIS — F900 Attention-deficit hyperactivity disorder, predominantly inattentive type: Secondary | ICD-10-CM | POA: Diagnosis not present

## 2019-02-19 DIAGNOSIS — I10 Essential (primary) hypertension: Secondary | ICD-10-CM | POA: Diagnosis not present

## 2019-02-19 DIAGNOSIS — E7800 Pure hypercholesterolemia, unspecified: Secondary | ICD-10-CM | POA: Diagnosis not present

## 2019-07-02 DIAGNOSIS — Z0000 Encounter for general adult medical examination without abnormal findings: Secondary | ICD-10-CM | POA: Diagnosis not present

## 2019-07-10 DIAGNOSIS — E559 Vitamin D deficiency, unspecified: Secondary | ICD-10-CM | POA: Diagnosis not present

## 2019-07-10 DIAGNOSIS — R7303 Prediabetes: Secondary | ICD-10-CM | POA: Diagnosis not present

## 2019-07-10 DIAGNOSIS — Z79899 Other long term (current) drug therapy: Secondary | ICD-10-CM | POA: Diagnosis not present

## 2019-07-10 DIAGNOSIS — E7800 Pure hypercholesterolemia, unspecified: Secondary | ICD-10-CM | POA: Diagnosis not present

## 2019-07-18 DIAGNOSIS — Z23 Encounter for immunization: Secondary | ICD-10-CM | POA: Diagnosis not present

## 2019-09-03 ENCOUNTER — Other Ambulatory Visit: Payer: Self-pay | Admitting: Family Medicine

## 2019-09-03 DIAGNOSIS — Z1231 Encounter for screening mammogram for malignant neoplasm of breast: Secondary | ICD-10-CM

## 2019-10-24 ENCOUNTER — Ambulatory Visit
Admission: RE | Admit: 2019-10-24 | Discharge: 2019-10-24 | Disposition: A | Discharge status: Home / Self Care | Payer: BC Managed Care – PPO | Source: Ambulatory Visit | Attending: Family Medicine | Admitting: Family Medicine

## 2019-10-24 ENCOUNTER — Other Ambulatory Visit: Payer: Self-pay

## 2019-10-24 DIAGNOSIS — Z1231 Encounter for screening mammogram for malignant neoplasm of breast: Secondary | ICD-10-CM

## 2019-12-25 DIAGNOSIS — I10 Essential (primary) hypertension: Secondary | ICD-10-CM | POA: Diagnosis not present

## 2019-12-25 DIAGNOSIS — E559 Vitamin D deficiency, unspecified: Secondary | ICD-10-CM | POA: Diagnosis not present

## 2019-12-25 DIAGNOSIS — F900 Attention-deficit hyperactivity disorder, predominantly inattentive type: Secondary | ICD-10-CM | POA: Diagnosis not present

## 2019-12-25 DIAGNOSIS — H608X1 Other otitis externa, right ear: Secondary | ICD-10-CM | POA: Diagnosis not present

## 2020-08-04 DIAGNOSIS — G4700 Insomnia, unspecified: Secondary | ICD-10-CM | POA: Diagnosis not present

## 2020-08-04 DIAGNOSIS — F411 Generalized anxiety disorder: Secondary | ICD-10-CM | POA: Diagnosis not present

## 2020-08-04 DIAGNOSIS — F900 Attention-deficit hyperactivity disorder, predominantly inattentive type: Secondary | ICD-10-CM | POA: Diagnosis not present

## 2020-08-04 DIAGNOSIS — Z23 Encounter for immunization: Secondary | ICD-10-CM | POA: Diagnosis not present

## 2020-09-08 DIAGNOSIS — Z0000 Encounter for general adult medical examination without abnormal findings: Secondary | ICD-10-CM | POA: Diagnosis not present

## 2020-09-08 DIAGNOSIS — Z23 Encounter for immunization: Secondary | ICD-10-CM | POA: Diagnosis not present

## 2020-09-09 DIAGNOSIS — E7800 Pure hypercholesterolemia, unspecified: Secondary | ICD-10-CM | POA: Diagnosis not present

## 2020-09-09 DIAGNOSIS — R7303 Prediabetes: Secondary | ICD-10-CM | POA: Diagnosis not present

## 2020-09-09 DIAGNOSIS — Z79899 Other long term (current) drug therapy: Secondary | ICD-10-CM | POA: Diagnosis not present

## 2020-09-09 DIAGNOSIS — E559 Vitamin D deficiency, unspecified: Secondary | ICD-10-CM | POA: Diagnosis not present

## 2020-09-17 DIAGNOSIS — Z03818 Encounter for observation for suspected exposure to other biological agents ruled out: Secondary | ICD-10-CM | POA: Diagnosis not present

## 2020-09-17 DIAGNOSIS — Z20822 Contact with and (suspected) exposure to covid-19: Secondary | ICD-10-CM | POA: Diagnosis not present

## 2020-10-31 DIAGNOSIS — M25521 Pain in right elbow: Secondary | ICD-10-CM | POA: Diagnosis not present

## 2020-11-05 DIAGNOSIS — Z1211 Encounter for screening for malignant neoplasm of colon: Secondary | ICD-10-CM | POA: Diagnosis not present

## 2020-11-11 DIAGNOSIS — Z20822 Contact with and (suspected) exposure to covid-19: Secondary | ICD-10-CM | POA: Diagnosis not present

## 2020-11-11 DIAGNOSIS — Z03818 Encounter for observation for suspected exposure to other biological agents ruled out: Secondary | ICD-10-CM | POA: Diagnosis not present

## 2020-11-26 DIAGNOSIS — M79601 Pain in right arm: Secondary | ICD-10-CM | POA: Diagnosis not present

## 2020-11-26 DIAGNOSIS — F430 Acute stress reaction: Secondary | ICD-10-CM | POA: Diagnosis not present

## 2020-11-26 DIAGNOSIS — M7712 Lateral epicondylitis, left elbow: Secondary | ICD-10-CM | POA: Diagnosis not present

## 2020-11-26 DIAGNOSIS — Z23 Encounter for immunization: Secondary | ICD-10-CM | POA: Diagnosis not present

## 2020-11-26 DIAGNOSIS — I10 Essential (primary) hypertension: Secondary | ICD-10-CM | POA: Diagnosis not present

## 2020-12-02 DIAGNOSIS — M25521 Pain in right elbow: Secondary | ICD-10-CM

## 2020-12-02 HISTORY — DX: Pain in right elbow: M25.521

## 2020-12-05 DIAGNOSIS — M25521 Pain in right elbow: Secondary | ICD-10-CM | POA: Diagnosis not present

## 2020-12-09 DIAGNOSIS — M25521 Pain in right elbow: Secondary | ICD-10-CM | POA: Diagnosis not present

## 2020-12-12 DIAGNOSIS — M25521 Pain in right elbow: Secondary | ICD-10-CM | POA: Diagnosis not present

## 2020-12-16 DIAGNOSIS — M25521 Pain in right elbow: Secondary | ICD-10-CM | POA: Diagnosis not present

## 2020-12-18 ENCOUNTER — Other Ambulatory Visit: Payer: Self-pay | Admitting: Family Medicine

## 2020-12-18 DIAGNOSIS — Z1231 Encounter for screening mammogram for malignant neoplasm of breast: Secondary | ICD-10-CM

## 2020-12-19 DIAGNOSIS — M25521 Pain in right elbow: Secondary | ICD-10-CM | POA: Diagnosis not present

## 2020-12-29 DIAGNOSIS — F900 Attention-deficit hyperactivity disorder, predominantly inattentive type: Secondary | ICD-10-CM | POA: Diagnosis not present

## 2020-12-29 DIAGNOSIS — I10 Essential (primary) hypertension: Secondary | ICD-10-CM | POA: Diagnosis not present

## 2020-12-29 DIAGNOSIS — F329 Major depressive disorder, single episode, unspecified: Secondary | ICD-10-CM | POA: Diagnosis not present

## 2020-12-31 DIAGNOSIS — M25521 Pain in right elbow: Secondary | ICD-10-CM | POA: Diagnosis not present

## 2021-01-02 DIAGNOSIS — M25521 Pain in right elbow: Secondary | ICD-10-CM | POA: Diagnosis not present

## 2021-01-05 DIAGNOSIS — M25521 Pain in right elbow: Secondary | ICD-10-CM | POA: Diagnosis not present

## 2021-01-07 DIAGNOSIS — M7712 Lateral epicondylitis, left elbow: Secondary | ICD-10-CM | POA: Diagnosis not present

## 2021-01-07 DIAGNOSIS — M25521 Pain in right elbow: Secondary | ICD-10-CM | POA: Diagnosis not present

## 2021-01-07 DIAGNOSIS — M25522 Pain in left elbow: Secondary | ICD-10-CM | POA: Diagnosis not present

## 2021-01-09 DIAGNOSIS — F4323 Adjustment disorder with mixed anxiety and depressed mood: Secondary | ICD-10-CM | POA: Diagnosis not present

## 2021-01-16 DIAGNOSIS — F4323 Adjustment disorder with mixed anxiety and depressed mood: Secondary | ICD-10-CM | POA: Diagnosis not present

## 2021-01-21 DIAGNOSIS — F4323 Adjustment disorder with mixed anxiety and depressed mood: Secondary | ICD-10-CM | POA: Diagnosis not present

## 2021-01-29 DIAGNOSIS — M25521 Pain in right elbow: Secondary | ICD-10-CM | POA: Diagnosis not present

## 2021-02-09 DIAGNOSIS — S46291D Other injury of muscle, fascia and tendon of other parts of biceps, right arm, subsequent encounter: Secondary | ICD-10-CM | POA: Diagnosis not present

## 2021-02-10 DIAGNOSIS — S46219A Strain of muscle, fascia and tendon of other parts of biceps, unspecified arm, initial encounter: Secondary | ICD-10-CM | POA: Insufficient documentation

## 2021-02-10 HISTORY — DX: Strain of muscle, fascia and tendon of other parts of biceps, unspecified arm, initial encounter: S46.219A

## 2021-02-11 DIAGNOSIS — F4323 Adjustment disorder with mixed anxiety and depressed mood: Secondary | ICD-10-CM | POA: Diagnosis not present

## 2021-02-12 ENCOUNTER — Ambulatory Visit: Payer: BC Managed Care – PPO

## 2021-02-28 DIAGNOSIS — F4323 Adjustment disorder with mixed anxiety and depressed mood: Secondary | ICD-10-CM | POA: Diagnosis not present

## 2021-03-03 DIAGNOSIS — S46211A Strain of muscle, fascia and tendon of other parts of biceps, right arm, initial encounter: Secondary | ICD-10-CM | POA: Diagnosis not present

## 2021-03-03 DIAGNOSIS — X58XXXA Exposure to other specified factors, initial encounter: Secondary | ICD-10-CM | POA: Diagnosis not present

## 2021-03-03 DIAGNOSIS — M66821 Spontaneous rupture of other tendons, right upper arm: Secondary | ICD-10-CM | POA: Diagnosis not present

## 2021-03-03 DIAGNOSIS — Y999 Unspecified external cause status: Secondary | ICD-10-CM | POA: Diagnosis not present

## 2021-03-10 DIAGNOSIS — F430 Acute stress reaction: Secondary | ICD-10-CM | POA: Diagnosis not present

## 2021-03-10 DIAGNOSIS — F900 Attention-deficit hyperactivity disorder, predominantly inattentive type: Secondary | ICD-10-CM | POA: Diagnosis not present

## 2021-03-10 DIAGNOSIS — F329 Major depressive disorder, single episode, unspecified: Secondary | ICD-10-CM | POA: Diagnosis not present

## 2021-03-18 DIAGNOSIS — F4323 Adjustment disorder with mixed anxiety and depressed mood: Secondary | ICD-10-CM | POA: Diagnosis not present

## 2021-03-18 DIAGNOSIS — M25629 Stiffness of unspecified elbow, not elsewhere classified: Secondary | ICD-10-CM | POA: Diagnosis not present

## 2021-03-18 DIAGNOSIS — S46291D Other injury of muscle, fascia and tendon of other parts of biceps, right arm, subsequent encounter: Secondary | ICD-10-CM | POA: Diagnosis not present

## 2021-03-25 DIAGNOSIS — M25629 Stiffness of unspecified elbow, not elsewhere classified: Secondary | ICD-10-CM | POA: Diagnosis not present

## 2021-03-30 DIAGNOSIS — M25629 Stiffness of unspecified elbow, not elsewhere classified: Secondary | ICD-10-CM | POA: Diagnosis not present

## 2021-04-01 DIAGNOSIS — F4323 Adjustment disorder with mixed anxiety and depressed mood: Secondary | ICD-10-CM | POA: Diagnosis not present

## 2021-04-03 ENCOUNTER — Ambulatory Visit
Admission: RE | Admit: 2021-04-03 | Discharge: 2021-04-03 | Disposition: A | Discharge status: Home / Self Care | Payer: BC Managed Care – PPO | Source: Ambulatory Visit | Attending: Family Medicine | Admitting: Family Medicine

## 2021-04-03 ENCOUNTER — Other Ambulatory Visit: Payer: Self-pay

## 2021-04-03 DIAGNOSIS — Z1231 Encounter for screening mammogram for malignant neoplasm of breast: Secondary | ICD-10-CM

## 2021-04-03 DIAGNOSIS — F4323 Adjustment disorder with mixed anxiety and depressed mood: Secondary | ICD-10-CM | POA: Diagnosis not present

## 2021-04-06 DIAGNOSIS — M25629 Stiffness of unspecified elbow, not elsewhere classified: Secondary | ICD-10-CM | POA: Diagnosis not present

## 2021-04-08 DIAGNOSIS — F4323 Adjustment disorder with mixed anxiety and depressed mood: Secondary | ICD-10-CM | POA: Diagnosis not present

## 2021-04-10 DIAGNOSIS — M25629 Stiffness of unspecified elbow, not elsewhere classified: Secondary | ICD-10-CM | POA: Diagnosis not present

## 2021-04-13 DIAGNOSIS — M25521 Pain in right elbow: Secondary | ICD-10-CM | POA: Diagnosis not present

## 2021-04-15 DIAGNOSIS — M25629 Stiffness of unspecified elbow, not elsewhere classified: Secondary | ICD-10-CM | POA: Diagnosis not present

## 2021-04-23 DIAGNOSIS — M25629 Stiffness of unspecified elbow, not elsewhere classified: Secondary | ICD-10-CM | POA: Diagnosis not present

## 2021-05-12 DIAGNOSIS — F4323 Adjustment disorder with mixed anxiety and depressed mood: Secondary | ICD-10-CM | POA: Diagnosis not present

## 2021-05-19 DIAGNOSIS — Z20822 Contact with and (suspected) exposure to covid-19: Secondary | ICD-10-CM | POA: Diagnosis not present

## 2021-05-27 DIAGNOSIS — M25521 Pain in right elbow: Secondary | ICD-10-CM | POA: Diagnosis not present

## 2021-06-02 DIAGNOSIS — F4323 Adjustment disorder with mixed anxiety and depressed mood: Secondary | ICD-10-CM | POA: Diagnosis not present

## 2021-06-09 DIAGNOSIS — F4323 Adjustment disorder with mixed anxiety and depressed mood: Secondary | ICD-10-CM | POA: Diagnosis not present

## 2021-06-17 DIAGNOSIS — F4323 Adjustment disorder with mixed anxiety and depressed mood: Secondary | ICD-10-CM | POA: Diagnosis not present

## 2021-07-10 DIAGNOSIS — F4323 Adjustment disorder with mixed anxiety and depressed mood: Secondary | ICD-10-CM | POA: Diagnosis not present

## 2021-07-22 DIAGNOSIS — Z79899 Other long term (current) drug therapy: Secondary | ICD-10-CM | POA: Diagnosis not present

## 2021-07-22 DIAGNOSIS — H905 Unspecified sensorineural hearing loss: Secondary | ICD-10-CM | POA: Diagnosis not present

## 2021-07-22 DIAGNOSIS — R413 Other amnesia: Secondary | ICD-10-CM | POA: Diagnosis not present

## 2021-07-22 DIAGNOSIS — G4700 Insomnia, unspecified: Secondary | ICD-10-CM | POA: Diagnosis not present

## 2021-07-22 DIAGNOSIS — Z818 Family history of other mental and behavioral disorders: Secondary | ICD-10-CM | POA: Diagnosis not present

## 2021-07-27 DIAGNOSIS — H90A22 Sensorineural hearing loss, unilateral, left ear, with restricted hearing on the contralateral side: Secondary | ICD-10-CM | POA: Diagnosis not present

## 2021-07-29 DIAGNOSIS — F4323 Adjustment disorder with mixed anxiety and depressed mood: Secondary | ICD-10-CM | POA: Diagnosis not present

## 2021-07-30 ENCOUNTER — Ambulatory Visit (INDEPENDENT_AMBULATORY_CARE_PROVIDER_SITE_OTHER): Payer: BC Managed Care – PPO | Admitting: Physician Assistant

## 2021-07-30 ENCOUNTER — Encounter: Payer: Self-pay | Admitting: Physician Assistant

## 2021-07-30 ENCOUNTER — Other Ambulatory Visit (INDEPENDENT_AMBULATORY_CARE_PROVIDER_SITE_OTHER): Payer: BC Managed Care – PPO

## 2021-07-30 ENCOUNTER — Other Ambulatory Visit: Payer: Self-pay

## 2021-07-30 VITALS — BP 131/85 | HR 98 | Resp 20 | Ht 65.5 in | Wt 203.0 lb

## 2021-07-30 DIAGNOSIS — Z820 Family history of epilepsy and other diseases of the nervous system: Secondary | ICD-10-CM

## 2021-07-30 DIAGNOSIS — R413 Other amnesia: Secondary | ICD-10-CM

## 2021-07-30 HISTORY — DX: Family history of epilepsy and other diseases of the nervous system: Z82.0

## 2021-07-30 NOTE — Patient Instructions (Addendum)
It was a pleasure to see you today at our office.   Recommendations:  Neurocognitive evaluation at our office MRI of the brain, the radiology office will call you to arrange you appointment Follow up after neurocognitive testing  RECOMMENDATIONS FOR ALL PATIENTS WITH MEMORY PROBLEMS: 1. Continue to exercise (Recommend 30 minutes of walking everyday, or 3 hours every week) 2. Increase social interactions - continue going to [place] and enjoy social gatherings with friends and family 3. Eat healthy, avoid fried foods and eat more fruits and vegetables 4. Maintain adequate blood pressure, blood sugar, and blood cholesterol level. Reducing the risk of stroke and cardiovascular disease also helps promoting better memory. 5. Avoid stressful situations. Live a simple life and avoid aggravations. Organize your time and prepare for the next day in anticipation. 6. Sleep well, avoid any interruptions of sleep and avoid any distractions in the bedroom that may interfere with adequate sleep quality 7. Avoid sugar, avoid sweets as there is a strong link between excessive sugar intake, diabetes, and cognitive impairment We discussed the Mediterranean diet, which has been shown to help patients reduce the risk of progressive memory disorders and reduces cardiovascular risk. This includes eating fish, eat fruits and green leafy vegetables, nuts like almonds and hazelnuts, walnuts, and also use olive oil. Avoid fast foods and fried foods as much as possible. Avoid sweets and sugar as sugar use has been linked to worsening of memory function.  There is always a concern of gradual progression of memory problems. If this is the case, then we may need to adjust level of care according to patient needs. Support, both to the patient and caregiver, should then be put into place.      You have been referred for a neuropsychological evaluation (i.e., evaluation of memory and thinking abilities). Please bring someone  with you to this appointment if possible, as it is helpful for the doctor to hear from both you and another adult who knows you well. Please bring eyeglasses and hearing aids if you wear them.    The evaluation will take approximately 3 hours and has two parts:   The first part is a clinical interview with the neuropsychologist (Dr. Milbert Coulter or Dr. Roseanne Reno). During the interview, the neuropsychologist will speak with you and the individual you brought to the appointment.    The second part of the evaluation is testing with the doctor's technician Annabelle Harman or Selena Batten). During the testing, the technician will ask you to remember different types of material, solve problems, and answer some questionnaires. Your family member will not be present for this portion of the evaluation.   Please note: We must reserve several hours of the neuropsychologist's time and the psychometrician's time for your evaluation appointment. As such, there is a No-Show fee of $100. If you are unable to attend any of your appointments, please contact our office as soon as possible to reschedule.    FALL PRECAUTIONS: Be cautious when walking. Scan the area for obstacles that may increase the risk of trips and falls. When getting up in the mornings, sit up at the edge of the bed for a few minutes before getting out of bed. Consider elevating the bed at the head end to avoid drop of blood pressure when getting up. Walk always in a well-lit room (use night lights in the walls). Avoid area rugs or power cords from appliances in the middle of the walkways. Use a walker or a cane if necessary and consider physical therapy  for balance exercise. Get your eyesight checked regularly.  FINANCIAL OVERSIGHT: Supervision, especially oversight when making financial decisions or transactions is also recommended.  HOME SAFETY: Consider the safety of the kitchen when operating appliances like stoves, microwave oven, and blender. Consider having supervision  and share cooking responsibilities until no longer able to participate in those. Accidents with firearms and other hazards in the house should be identified and addressed as well.   ABILITY TO BE LEFT ALONE: If patient is unable to contact 911 operator, consider using LifeLine, or when the need is there, arrange for someone to stay with patients. Smoking is a fire hazard, consider supervision or cessation. Risk of wandering should be assessed by caregiver and if detected at any point, supervision and safe proof recommendations should be instituted.  MEDICATION SUPERVISION: Inability to self-administer medication needs to be constantly addressed. Implement a mechanism to ensure safe administration of the medications.   DRIVING: Regarding driving, in patients with progressive memory problems, driving will be impaired. We advise to have someone else do the driving if trouble finding directions or if minor accidents are reported. Independent driving assessment is available to determine safety of driving.   If you are interested in the driving assessment, you can contact the following:  [facility] [employer] [facility] in [place] [phone]  [facility] [phone]  [facility] [phone] or [phone]    Mediterranean Diet A Mediterranean diet refers to food and lifestyle choices that are based on the traditions of countries located on the [employer]. This way of eating has been shown to help prevent certain conditions and improve outcomes for people who have chronic diseases, like kidney disease and heart disease. What are tips for following this plan? Lifestyle  Cook and eat meals together with your family, when possible. Drink enough fluid to keep your urine clear or pale yellow. Be physically active every day. This includes: Aerobic exercise like running or swimming. Leisure activities like gardening, walking, or  housework. Get 7-8 hours of sleep each night. If recommended by your health care provider, drink red wine in moderation. This means 1 glass a day for nonpregnant women and 2 glasses a day for men. A glass of wine equals 5 oz (150 mL). Reading food labels  Check the serving size of packaged foods. For foods such as rice and pasta, the serving size refers to the amount of cooked product, not dry. Check the total fat in packaged foods. Avoid foods that have saturated fat or trans fats. Check the ingredients list for added sugars, such as corn syrup. Shopping  At the grocery store, buy most of your food from the areas near the walls of the store. This includes: Fresh fruits and vegetables (produce). Grains, beans, nuts, and seeds. Some of these may be available in unpackaged forms or large amounts (in bulk). Fresh seafood. Poultry and eggs. Low-fat dairy products. Buy whole ingredients instead of prepackaged foods. Buy fresh fruits and vegetables in-season from local farmers markets. Buy frozen fruits and vegetables in resealable bags. If you do not have access to quality fresh seafood, buy precooked frozen shrimp or canned fish, such as tuna, salmon, or sardines. Buy small amounts of raw or cooked vegetables, salads, or olives from the deli or salad bar at your store. Stock your pantry so you always have certain foods on hand, such as olive oil, canned tuna, canned tomatoes, rice, pasta, and beans. Cooking  Cook foods with extra-virgin olive oil instead  of using butter or other vegetable oils. Have meat as a side dish, and have vegetables or grains as your main dish. This means having meat in small portions or adding small amounts of meat to foods like pasta or stew. Use beans or vegetables instead of meat in common dishes like chili or lasagna. Experiment with different cooking methods. Try roasting or broiling vegetables instead of steaming or sauteing them. Add frozen vegetables to soups,  stews, pasta, or rice. Add nuts or seeds for added healthy fat at each meal. You can add these to yogurt, salads, or vegetable dishes. Marinate fish or vegetables using olive oil, lemon juice, garlic, and fresh herbs. Meal planning  Plan to eat 1 vegetarian meal one day each week. Try to work up to 2 vegetarian meals, if possible. Eat seafood 2 or more times a week. Have healthy snacks readily available, such as: Vegetable sticks with hummus. Greek yogurt. Fruit and nut trail mix. Eat balanced meals throughout the week. This includes: Fruit: 2-3 servings a day Vegetables: 4-5 servings a day Low-fat dairy: 2 servings a day Fish, poultry, or lean meat: 1 serving a day Beans and legumes: 2 or more servings a week Nuts and seeds: 1-2 servings a day Whole grains: 6-8 servings a day Extra-virgin olive oil: 3-4 servings a day Limit red meat and sweets to only a few servings a month What are my food choices? Mediterranean diet Recommended Grains: Whole-grain pasta. Brown rice. Bulgar wheat. Polenta. Couscous. Whole-wheat bread. Orpah Cobb. Vegetables: Artichokes. Beets. Broccoli. Cabbage. Carrots. Eggplant. Green beans. Chard. Kale. Spinach. Onions. Leeks. Peas. Squash. Tomatoes. Peppers. Radishes. Fruits: Apples. Apricots. Avocado. Berries. Bananas. Cherries. Dates. Figs. Grapes. Lemons. Melon. Oranges. Peaches. Plums. Pomegranate. Meats and other protein foods: Beans. Almonds. Sunflower seeds. Pine nuts. Peanuts. Cod. Salmon. Scallops. Shrimp. Tuna. Tilapia. Clams. Oysters. Eggs. Dairy: Low-fat milk. Cheese. Greek yogurt. Beverages: Water. Red wine. Herbal tea. Fats and oils: Extra virgin olive oil. Avocado oil. Grape seed oil. Sweets and desserts: [place] yogurt with honey. Baked apples. Poached pears. Trail mix. Seasoning and other foods: Basil. Cilantro. Coriander. Cumin. Mint. Parsley. Sage. Rosemary. Tarragon. Garlic. Oregano. Thyme. Pepper. Balsalmic vinegar. Tahini. Hummus. Tomato  sauce. Olives. Mushrooms. Limit these Grains: Prepackaged pasta or rice dishes. Prepackaged cereal with added sugar. Vegetables: Deep fried potatoes (french fries). Fruits: Fruit canned in syrup. Meats and other protein foods: Beef. Pork. Lamb. Poultry with skin. Hot dogs. Tomasa Blase. Dairy: Ice cream. Sour cream. Whole milk. Beverages: Juice. Sugar-sweetened soft drinks. Beer. Liquor and spirits. Fats and oils: Butter. Canola oil. Vegetable oil. Beef fat (tallow). Lard. Sweets and desserts: Cookies. Cakes. Pies. Candy. Seasoning and other foods: Mayonnaise. Premade sauces and marinades. The items listed may not be a complete list. Talk with your dietitian about what dietary choices are right for you. Summary The Mediterranean diet includes both food and lifestyle choices. Eat a variety of fresh fruits and vegetables, beans, nuts, seeds, and whole grains. Limit the amount of red meat and sweets that you eat. Talk with your health care provider about whether it is safe for you to drink red wine in moderation. This means 1 glass a day for nonpregnant women and 2 glasses a day for men. A glass of wine equals 5 oz (150 mL). This information is not intended to replace advice given to you by your health care provider. Make sure you discuss any questions you have with your health care provider. Document Released: 05/27/2016 Document Revised: 06/29/2016 Document Reviewed: 05/27/2016 Elsevier Interactive  Patient Education  2017 [employer].   We have sent a referral to [facility] for your MRI and they will call you directly to schedule your appointment. They are located at [address] [facility]. If you need to contact them directly please call [phone].   Your provider has requested that you have labwork completed today. Please go to [facility] ([id number]) on the second floor of this building before leaving the office today. You do not need to check in. If you are not called within  15 minutes please check with the front desk.

## 2021-07-30 NOTE — Progress Notes (Signed)
Assessment/Plan:   Alicia Buchanan is a 60 y.o. year old female with risk factors including  age, hypertension, hyperlipidemia, ADHD (predominantly inattentive type), vitamin D deficiency, tobacco abuse, mild hearing loss, history of biliary cirrhosis, depression and  seen today for evaluation of memory loss.  MoCA today is 27/30, with deficiencies in attention.   Recommendations:   Memory Loss  Strong family history of Alzheimer's disease  MRI brain with/without contrast to assess for underlying structural abnormality and assess vascular load  Neurocognitive testing to further evaluate cognitive concerns and determine underlying cause of memory changes, including potential contribution from sleep, anxiety, ADHD, or depression  Check B1  Discussed safety both in and out of the home.  Discussed the importance of regular daily schedule with inclusion of crossword puzzles to maintain brain function.  Continue to monitor mood with PCP.  Stay active at least 30 minutes at least 3 times a week.  Naps should be scheduled and should be no longer than 60 minutes and should not occur after 2 PM.  Mediterranean diet is recommended  Folllow up once results above are available   Subjective:    The patient is seen in neurologic consultation at the request of Alicia Palmer, MD for the evaluation of memory.  The patient is here alone.. This is a 60 y.o. year old female who reports "nothing is disruptive, I do not asked the same questions or tell the same stories ".  "My mother and other members of my family have Alzheimer's dementia, and it worries me, when I go to a room and I cannot remember what I went to do there, or I give my dogs the wrong name ".  She does state that her job in [employer] at times can become stressful.  She does have a history of depression, managed with several medications, without recent changes in them.  Denies irritability.  She lives with her wife Alicia Buchanan, who has not noticed this  changes.  She has mild insomnia, but denies any vivid dreams, REM behavior or sleepwalking.  Denies hallucinations or paranoia, or leaving objects in unusual places.  Independent of bathing and dressing, medications and finances.  She is trying to modify her lifestyle with inclusion of diet and exercise, having lost 17 pounds since.  She admits to not drinking enough water.  Denies trouble swallowing.  She cooks without leaving the stove or the faucet on.  She ambulates without difficulty, without the use of a walker or a cane, she walks frequently, denying any falls, or head injuries.  She drives, denies being disoriented while driving.  She is hard of hearing, and is in the process of getting new hearing aids. Denies headaches, double vision, dizziness, focal numbness or tingling, unilateral weakness or tremors. Denies urine incontinence or retention. Denies constipation or diarrhea. Denies anosmia. Denies history of OSA, she drinks 3-4 d a week  1-2 wine glasses, she smokes half to 1 pack a day of tobacco for at least 30 years.  Family history remarkable for maternal grandfather, mother, and maternal uncle all with Alzheimer's disease.  Labs in October 2022 B12 10/5   354 TSH 1.18 CBC   WBC 12, dehydration, inflammatory process from biliary cirrhosis  AlP 148   No Known Allergies  Current Outpatient Medications  Medication Instructions   ALPRAZolam (XANAX) 0.5 mg, At bedtime PRN   amphetamine-dextroamphetamine (ADDERALL) 20 MG tablet No dose, route, or frequency recorded.   cloNIDine (CATAPRES) 0.1 mg, Oral, Daily at bedtime  desvenlafaxine (PRISTIQ) 50 mg, Daily   FLUoxetine (PROZAC) 40 mg, Oral, Daily   ibuprofen (ADVIL) 400 mg, Every 6 hours PRN   lisinopril (ZESTRIL) 10 MG tablet No dose, route, or frequency recorded.   nebivolol (BYSTOLIC) 10 mg, Daily   oxyCODONE-acetaminophen (PERCOCET/ROXICET) 5-325 MG per tablet 1-2 tablets, Every 4 hours PRN   oxyCODONE-acetaminophen  (PERCOCET/ROXICET) 5-325 MG per tablet 2 tablets, Oral, Every 4 hours PRN   rosuvastatin (CRESTOR) 10 mg, Daily   ursodiol (ACTIGALL) 600 mg, 2 times daily   varenicline (CHANTIX) 0.5 mg, 2 times daily   zolpidem (AMBIEN) 5 mg, At bedtime PRN     VITALS:   Vitals:   07/30/21 0800  BP: 131/85  Pulse: 98  Resp: 20  SpO2: 96%  Weight: 203 lb (92.1 kg)  Height: 5' 5.5" (1.664 m)   No flowsheet data found.  PHYSICAL EXAM   HEENT:  Normocephalic, atraumatic. The mucous membranes are moist. The superficial temporal arteries are without ropiness or tenderness. Cardiovascular: Regular rate and rhythm. Lungs: Clear to auscultation bilaterally. Neck: There are no carotid bruits noted bilaterally.  NEUROLOGICAL: Montreal Cognitive Assessment  07/30/2021  Visuospatial/ Executive (0/5) 5  Naming (0/3) 3  Attention: Read list of digits (0/2) 2  Attention: Read list of letters (0/1) 1  Attention: Serial 7 subtraction starting at 100 (0/3) 1  Language: Repeat phrase (0/2) 2  Language : Fluency (0/1) 1  Abstraction (0/2) 1  Delayed Recall (0/5) 5  Orientation (0/6) 6  Total 27  Adjusted Score (based on education) 27   No flowsheet data found.  No flowsheet data found.   Orientation:  Alert and oriented to person, place and time. No aphasia or dysarthria. Fund of knowledge is appropriate. Recent memory impaired and remote memory intact.  Attention and concentration are reduced  Able to name objects and repeat phrases. Delayed recall  5/5 Cranial nerves: There is good facial symmetry. Extraocular muscles are intact and visual fields are full to confrontational testing. Speech is fluent and clear. Soft palate rises symmetrically and there is no tongue deviation. Hearing is intact to conversational tone. Tone: Tone is good throughout. Sensation: Sensation is intact to light touch and pinprick throughout. Vibration is intact at the bilateral big toe.There is no extinction with double  simultaneous stimulation. There is no sensory dermatomal level identified. Coordination: The patient has no difficulty with RAM's or FNF bilaterally. Normal finger to nose  Motor: Strength is 5/5 in the bilateral upper and lower extremities. There is no pronator drift. There are no fasciculations noted. DTR's: Deep tendon reflexes are 2/4 at the bilateral biceps, triceps, brachioradialis, patella and achilles.  Plantar responses are downgoing bilaterally. Gait and Station: The patient is able to ambulate without difficulty.The patient is able to heel toe walk without any difficulty.The patient is able to ambulate in a tandem fashion. The patient is able to stand in the Romberg position.     Thank you for allowing [place] the opportunity to participate in the care of this nice patient. Please do not hesitate to contact us for any questions or concerns.   Total time spent on today's visit was 55 minutes, including both face-to-face time and nonface-to-face time.  Time included that spent on review of records (prior notes available to me/labs/imaging if pertinent), discussing treatment and goals, answering patient's questions and coordinating care.  Cc:  Alicia Palmer, MD  Marlowe Kays 07/30/2021 9:16 AM

## 2021-08-03 LAB — VITAMIN B1: Vitamin B1 (Thiamine): <6 nmol/L — ABNORMAL LOW (ref 8–30)

## 2021-08-14 ENCOUNTER — Ambulatory Visit
Admission: RE | Admit: 2021-08-14 | Discharge: 2021-08-14 | Disposition: A | Discharge status: Home / Self Care | Payer: BC Managed Care – PPO | Source: Ambulatory Visit | Attending: Physician Assistant | Admitting: Physician Assistant

## 2021-08-14 DIAGNOSIS — R413 Other amnesia: Secondary | ICD-10-CM | POA: Diagnosis not present

## 2021-08-14 MED ORDER — GADOBENATE DIMEGLUMINE 529 MG/ML IV SOLN
19.0000 mL | Freq: Once | INTRAVENOUS | Status: AC | PRN
  Administered 2021-08-14: 19 mL via INTRAVENOUS

## 2021-08-26 DIAGNOSIS — H90A22 Sensorineural hearing loss, unilateral, left ear, with restricted hearing on the contralateral side: Secondary | ICD-10-CM | POA: Diagnosis not present

## 2021-09-03 ENCOUNTER — Encounter: Payer: Self-pay | Admitting: Psychology

## 2021-09-22 DIAGNOSIS — Z0000 Encounter for general adult medical examination without abnormal findings: Secondary | ICD-10-CM | POA: Diagnosis not present

## 2021-09-25 DIAGNOSIS — E7800 Pure hypercholesterolemia, unspecified: Secondary | ICD-10-CM | POA: Diagnosis not present

## 2021-09-25 DIAGNOSIS — Z79899 Other long term (current) drug therapy: Secondary | ICD-10-CM | POA: Diagnosis not present

## 2021-09-25 DIAGNOSIS — E559 Vitamin D deficiency, unspecified: Secondary | ICD-10-CM | POA: Diagnosis not present

## 2021-09-25 DIAGNOSIS — R7303 Prediabetes: Secondary | ICD-10-CM | POA: Diagnosis not present

## 2021-09-28 DIAGNOSIS — D123 Benign neoplasm of transverse colon: Secondary | ICD-10-CM | POA: Diagnosis not present

## 2021-09-28 DIAGNOSIS — Z1211 Encounter for screening for malignant neoplasm of colon: Secondary | ICD-10-CM | POA: Diagnosis not present

## 2021-10-21 DIAGNOSIS — F4323 Adjustment disorder with mixed anxiety and depressed mood: Secondary | ICD-10-CM | POA: Diagnosis not present

## 2021-10-28 DIAGNOSIS — F4323 Adjustment disorder with mixed anxiety and depressed mood: Secondary | ICD-10-CM | POA: Diagnosis not present

## 2021-11-24 ENCOUNTER — Other Ambulatory Visit (HOSPITAL_BASED_OUTPATIENT_CLINIC_OR_DEPARTMENT_OTHER): Payer: Self-pay

## 2021-11-24 DIAGNOSIS — M5431 Sciatica, right side: Secondary | ICD-10-CM | POA: Diagnosis not present

## 2021-11-24 DIAGNOSIS — Z23 Encounter for immunization: Secondary | ICD-10-CM | POA: Diagnosis not present

## 2021-11-24 DIAGNOSIS — F900 Attention-deficit hyperactivity disorder, predominantly inattentive type: Secondary | ICD-10-CM | POA: Diagnosis not present

## 2021-11-24 MED ORDER — AMPHETAMINE-DEXTROAMPHET ER 20 MG PO CP24
ORAL_CAPSULE | ORAL | 0 refills | Status: DC
  Filled 2021-11-24: qty 90, 30d supply, fill #0

## 2021-12-09 DIAGNOSIS — F4323 Adjustment disorder with mixed anxiety and depressed mood: Secondary | ICD-10-CM | POA: Diagnosis not present

## 2021-12-23 DIAGNOSIS — F4323 Adjustment disorder with mixed anxiety and depressed mood: Secondary | ICD-10-CM | POA: Diagnosis not present

## 2021-12-24 ENCOUNTER — Other Ambulatory Visit (HOSPITAL_BASED_OUTPATIENT_CLINIC_OR_DEPARTMENT_OTHER): Payer: Self-pay

## 2021-12-24 MED ORDER — AMPHETAMINE-DEXTROAMPHET ER 20 MG PO CP24
ORAL_CAPSULE | ORAL | 0 refills | Status: AC
  Filled 2021-12-24: qty 60, 30d supply, fill #0

## 2021-12-24 MED ORDER — AMPHETAMINE-DEXTROAMPHET ER 20 MG PO CP24: ORAL_CAPSULE | ORAL | 0 refills | Status: AC

## 2021-12-25 ENCOUNTER — Other Ambulatory Visit (HOSPITAL_BASED_OUTPATIENT_CLINIC_OR_DEPARTMENT_OTHER): Payer: Self-pay

## 2021-12-28 ENCOUNTER — Other Ambulatory Visit (HOSPITAL_BASED_OUTPATIENT_CLINIC_OR_DEPARTMENT_OTHER): Payer: Self-pay

## 2022-01-06 ENCOUNTER — Encounter: Payer: Self-pay | Admitting: Psychology

## 2022-01-06 DIAGNOSIS — K529 Noninfective gastroenteritis and colitis, unspecified: Secondary | ICD-10-CM | POA: Insufficient documentation

## 2022-01-06 DIAGNOSIS — Z720 Tobacco use: Secondary | ICD-10-CM | POA: Insufficient documentation

## 2022-01-06 DIAGNOSIS — R748 Abnormal levels of other serum enzymes: Secondary | ICD-10-CM | POA: Insufficient documentation

## 2022-01-06 DIAGNOSIS — G4700 Insomnia, unspecified: Secondary | ICD-10-CM | POA: Insufficient documentation

## 2022-01-06 DIAGNOSIS — M5430 Sciatica, unspecified side: Secondary | ICD-10-CM | POA: Insufficient documentation

## 2022-01-06 DIAGNOSIS — E559 Vitamin D deficiency, unspecified: Secondary | ICD-10-CM | POA: Insufficient documentation

## 2022-01-06 DIAGNOSIS — F900 Attention-deficit hyperactivity disorder, predominantly inattentive type: Secondary | ICD-10-CM | POA: Insufficient documentation

## 2022-01-06 DIAGNOSIS — R7303 Prediabetes: Secondary | ICD-10-CM | POA: Insufficient documentation

## 2022-01-06 DIAGNOSIS — R739 Hyperglycemia, unspecified: Secondary | ICD-10-CM | POA: Insufficient documentation

## 2022-01-06 DIAGNOSIS — F329 Major depressive disorder, single episode, unspecified: Secondary | ICD-10-CM | POA: Insufficient documentation

## 2022-01-06 DIAGNOSIS — I10 Essential (primary) hypertension: Secondary | ICD-10-CM | POA: Insufficient documentation

## 2022-01-06 DIAGNOSIS — K743 Primary biliary cirrhosis: Secondary | ICD-10-CM | POA: Insufficient documentation

## 2022-01-06 DIAGNOSIS — H9190 Unspecified hearing loss, unspecified ear: Secondary | ICD-10-CM | POA: Insufficient documentation

## 2022-01-06 DIAGNOSIS — E7800 Pure hypercholesterolemia, unspecified: Secondary | ICD-10-CM | POA: Insufficient documentation

## 2022-01-06 DIAGNOSIS — F411 Generalized anxiety disorder: Secondary | ICD-10-CM | POA: Insufficient documentation

## 2022-01-06 HISTORY — DX: Abnormal levels of other serum enzymes: R74.8

## 2022-01-07 ENCOUNTER — Other Ambulatory Visit: Payer: Self-pay

## 2022-01-07 ENCOUNTER — Ambulatory Visit: Payer: BC Managed Care – PPO

## 2022-01-07 ENCOUNTER — Encounter: Payer: Self-pay | Admitting: Psychology

## 2022-01-07 ENCOUNTER — Ambulatory Visit (INDEPENDENT_AMBULATORY_CARE_PROVIDER_SITE_OTHER): Payer: BC Managed Care – PPO | Admitting: Psychology

## 2022-01-07 DIAGNOSIS — R4189 Other symptoms and signs involving cognitive functions and awareness: Secondary | ICD-10-CM

## 2022-01-07 DIAGNOSIS — F411 Generalized anxiety disorder: Secondary | ICD-10-CM | POA: Diagnosis not present

## 2022-01-07 DIAGNOSIS — R41840 Attention and concentration deficit: Secondary | ICD-10-CM

## 2022-01-07 DIAGNOSIS — Z820 Family history of epilepsy and other diseases of the nervous system: Secondary | ICD-10-CM | POA: Diagnosis not present

## 2022-01-07 NOTE — Progress Notes (Signed)
? ?NEUROPSYCHOLOGICAL EVALUATION ?[place]. [facility] ?[place] Department of Neurology ? ?Date of Evaluation: January 07, 2022 ? ?Reason for Referral:  ? ?Alicia Buchanan is a 61 y.o. right-handed Caucasian female referred by  Marlowe Kays, PA-C , to characterize her current cognitive functioning and assist with diagnostic clarity and treatment planning in the context of subjective cognitive decline and a family history of Alzheimer's disease.  ? ?Assessment and Plan:  ? ?Clinical Impression(s): ?Alicia Buchanan's pattern of performance is suggestive of neuropsychological functioning within normal limits relative to age-matched peers. Isolated weaknesses were exhibited across some aspects of cognitive flexibility, as well as when differentiating between words that were presented as a part of a list learning task. Performance was appropriate across processing speed, attention/concentration, the majority of executive functioning, receptive and expressive language, visuospatial abilities, and learning and memory. Alicia Buchanan denied difficulties completing instrumental activities of daily living (ADLs) independently.  ? ?During testing, Alicia Buchanan was noted to become very distressed when having trouble with visual scanning across a motorized cognitive flexibility task (TMT B). This escalated to the extent that she became tearful and was actively weeping for a short period of time. She was able to collect herself well. Importantly, her isolated relative weakness surrounding cognitive flexibility was across tasks administered during this brief period of heightened anxiety. This would suggest that test scores were influenced by emotions and acute stressors, especially as other performances surrounding executive functioning were appropriate. They could also be caused by her history of underlying attentional dysregulation due to ADHD.  ? ?Specific to memory, Alicia Buchanan was able to learn novel verbal and visual  information efficiently and retain this knowledge after lengthy delays. Overall, memory performance combined with intact performances across other areas of cognitive functioning is not suggestive of Alzheimer's disease. Likewise, her cognitive and behavioral profile is not suggestive of any other form of neurodegenerative illness presently. ? ?Recommendations: ?Should Alicia Buchanan experience cognitive and/or functional decline in the future, a repeat evaluation would be warranted at that time. The current evaluation will serve as an excellent baseline to make future comparisons against.  ? ?Based upon Alicia Buchanan's current medication list, she is on several medications with known cognitive side effects (i.e., alprazolam/Xanax, oxycodone/Percocet, and zolpidem/Ambien). If she takes these medications regularly, this could contribute to brain fog and mild memory/attentional lapses in her day-to-day life. If desired, she could discuss medication alternatives with her prescribing physician.  ? ?Alicia Buchanan is encouraged to attend to lifestyle factors for brain health (e.g., regular physical exercise, good nutrition habits, regular participation in cognitively-stimulating activities, and general stress management techniques), which are likely to have benefits for both emotional adjustment and cognition. In fact, in addition to promoting good general health, regular exercise incorporating aerobic activities (e.g., brisk walking, jogging, cycling, etc.) has been demonstrated to be a very effective treatment for depression and stress, with similar efficacy rates to both antidepressant medication and psychotherapy. Optimal control of vascular risk factors (including safe cardiovascular exercise and adherence to dietary recommendations) is encouraged. Continued participation in activities which provide mental stimulation and social interaction is also recommended.  ? ?When learning new information, she would benefit from  information being broken up into small, manageable pieces. She may also find it helpful to articulate the material in her own words and in a context to promote encoding at the onset of a new task. This material may need to be repeated multiple times to promote encoding. ? ?Memory can be improved using internal  strategies such as rehearsal, repetition, chunking, mnemonics, association, and imagery. External strategies such as written notes in a consistently used memory journal, visual and nonverbal auditory cues such as a calendar on the refrigerator or appointments with alarm, such as on a cell phone, can also help maximize recall.   ? ?To address problems with fluctuating attention, she may wish to consider: ?  -Avoiding external distractions when needing to concentrate ?  -Limiting exposure to fast paced environments with multiple sensory demands ?  -Writing down complicated information and using checklists ?  -Attempting and completing one task at a time (i.e., no multi-tasking) ?  -Verbalizing aloud each step of a task to maintain focus ?  -Reducing the amount of information considered at one time ? ?Review of Records:  ? ?Alicia Buchanan was seen by [facility] Sharene Butters, PA-C) on 07/30/2021 for an evaluation of memory loss. Regarding memory, Alicia Buchanan reported "nothing is disruptive, I do not ask the same questions or tell the same stories. She described entering rooms and forgetting her original intention, as well as sometimes calling her dogs the wrong name. She has a significant family history of Alzheimer's disease which heightens concerns. Regarding mood, there is a history of mild depressive symptoms treated with medication. She also described acute stressors surrounding work and other psychosocial factors. Trouble with ADLs was denied. There was reported regular alcohol and tobacco use. Performance on a brief cognitive screening instrument (MOCA) was 27/30. Ultimately, Alicia Buchanan was referred for  a comprehensive neuropsychological evaluation to characterize her cognitive abilities and to assist with diagnostic clarity and treatment planning.  ? 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 Cerebral volume was age appropriate.  ? ?Past Medical History:  ?Diagnosis Date  ? Abnormal levels of other serum enzymes 01/06/2022  ? Acquired hearing loss   ? ADHD (attention deficit hyperactivity disorder), inattentive type   ? Chronic otitis externa of both ears 02/25/2017  ? Colitis   ? Crohn's disease   ? Essential hypertension   ? Family history of Alzheimer's disease 07/30/2021  ? Generalized anxiety disorder   ? Hypercholesteremia   ? Hyperglycemia   ? Hypertension   ? Insomnia   ? Lateral epicondylitis 03/03/2018  ? Major depressive disorder   ? Pain in joint of left elbow 03/03/2018  ? Pain in joint of right elbow 12/02/2020  ? Prediabetes   ? Primary biliary cirrhosis   ? Pure hypercholesterolemia   ? Sciatica   ? Tear of distal tendon of biceps 02/10/2021  ? Tobacco use   ? Vitamin D deficiency   ?  ?Past Surgical History:  ?Procedure Laterality Date  ? KNEE SURGERY    ? LAPAROSCOPIC APPENDECTOMY  09/10/2012  ? Procedure: APPENDECTOMY LAPAROSCOPIC;  Surgeon: Pedro Earls, MD;  Location: [facility];  Service: General;  Laterality: N/A;  ? TONSILLECTOMY    ?  ?Current Outpatient Medications:  ?  ALPRAZolam (XANAX) 0.5 MG tablet, Take 0.5 mg by mouth at bedtime as needed. ANXIETY, Disp: , Rfl:  ?  amphetamine-dextroamphetamine (ADDERALL XR) 20 MG 24 hr capsule, 2-3 capsules in the morning Orally Once a day 30 days, Disp: 60 capsule, Rfl: 0 ?  amphetamine-dextroamphetamine (ADDERALL XR) 20 MG 24 hr capsule, 2-3 capsules Orally Once a day 30 days, Disp: 90 capsule, Rfl: 0 ?  amphetamine-dextroamphetamine (ADDERALL) 20 MG tablet, ,  Disp: , Rfl:  ?   cloNIDine (CATAPRES) 0.1 MG tablet, Take 0.1 mg by mouth at bedtime., Disp: , Rfl:  ?  desvenlafaxine (PRISTIQ) 50 MG 24 hr tablet, Take 50 mg by mouth daily., Disp: , Rfl:  ?  FLUoxetine (PROZAC) 40 MG capsule, Take 40 m

## 2022-01-07 NOTE — Progress Notes (Signed)
? ?  Psychometrician Note ?  ?Cognitive testing was administered to Alicia Buchanan by Shan Levans, B.S. (psychometrist) under the supervision of Dr. Newman Nickels, Ph.D., licensed psychologist on 01/07/2022. Ms. Pundt did not appear overtly distressed by the testing session per behavioral observation or responses across self-report questionnaires. Rest breaks were offered.  ?  ?The battery of tests administered was selected by Dr. Newman Nickels, Ph.D. with consideration to Alicia Buchanan's current level of functioning, the nature of her symptoms, emotional and behavioral responses during interview, level of literacy, observed level of motivation/effort, and the nature of the referral question. This battery was communicated to the psychometrist. Communication between Dr. Newman Nickels, Ph.D. and the psychometrist was ongoing throughout the evaluation and Dr. Newman Nickels, Ph.D. was immediately accessible at all times. Dr. Newman Nickels, Ph.D. provided supervision to the psychometrist on the date of this service to the extent necessary to assure the quality of all services provided.  ?  ?Alicia Buchanan will return within approximately 1-2 weeks for an interactive feedback session with Dr. Milbert Coulter at which time her test performances, clinical impressions, and treatment recommendations will be reviewed in detail. Alicia Buchanan understands she can contact our office should she require our assistance before this time. ? ?A total of 145 minutes of billable time were spent face-to-face with Alicia Buchanan by the psychometrist. This includes both test administration and scoring time. Billing for these services is reflected in the clinical report generated by Dr. Newman Nickels, Ph.D. ? ?This note reflects time spent with the psychometrician and does not include test scores or any clinical interpretations made by Dr. Milbert Coulter. The full report will follow in a separate note.  ?

## 2022-01-13 DIAGNOSIS — F4323 Adjustment disorder with mixed anxiety and depressed mood: Secondary | ICD-10-CM | POA: Diagnosis not present

## 2022-01-15 ENCOUNTER — Ambulatory Visit (INDEPENDENT_AMBULATORY_CARE_PROVIDER_SITE_OTHER): Payer: BC Managed Care – PPO | Admitting: Psychology

## 2022-01-15 DIAGNOSIS — R41840 Attention and concentration deficit: Secondary | ICD-10-CM

## 2022-01-15 DIAGNOSIS — Z820 Family history of epilepsy and other diseases of the nervous system: Secondary | ICD-10-CM | POA: Diagnosis not present

## 2022-01-15 NOTE — Progress Notes (Signed)
? ?  Neuropsychology Feedback Session ?[place]. [facility] ?[place] Department of Neurology ? ?Reason for Referral:  ? ?Cyndra Feinberg is a 61 y.o. right-handed Caucasian female referred by  Marlowe Kays, PA-C , to characterize her current cognitive functioning and assist with diagnostic clarity and treatment planning in the context of subjective cognitive decline and a family history of Alzheimer's disease.  ? ?Feedback:  ? ?Ms. Kiener completed a comprehensive neuropsychological evaluation on 01/07/2022. Please refer to that encounter for the full report and recommendations. Briefly, results suggested neuropsychological functioning within normal limits relative to age-matched peers. Isolated weaknesses were exhibited across some aspects of cognitive flexibility, as well as when differentiating between words that were presented as a part of a list learning task. During testing, Ms. Heinemann was noted to become very distressed when having trouble with visual scanning across a motorized cognitive flexibility task (TMT B). This escalated to the extent that she became tearful and was actively weeping for a short period of time. She was able to collect herself well. Importantly, her isolated relative weakness surrounding cognitive flexibility was across tasks administered during this brief period of heightened anxiety. This would suggest that test scores were influenced by emotions and acute stressors, especially as other performances surrounding executive functioning were appropriate. They could also be caused by her history of underlying attentional dysregulation due to ADHD.  ? ?Ms. Rosman was accompanied by her wife during the current feedback session. Content of the current session focused on the results of her neuropsychological evaluation. Ms. Lapage was given the opportunity to ask questions and her questions were answered. She was encouraged to reach out should additional questions arise. A copy of  her report was provided at the conclusion of the visit.  ? ?  ? ?21 minutes were spent conducting the current feedback session with Ms. Goh, billed as one unit [phone].  ?

## 2022-01-18 ENCOUNTER — Other Ambulatory Visit (HOSPITAL_BASED_OUTPATIENT_CLINIC_OR_DEPARTMENT_OTHER): Payer: Self-pay

## 2022-01-22 ENCOUNTER — Encounter: Payer: BC Managed Care – PPO | Admitting: Psychology

## 2022-01-26 ENCOUNTER — Encounter: Payer: BC Managed Care – PPO | Admitting: Psychology

## 2022-02-02 ENCOUNTER — Other Ambulatory Visit (HOSPITAL_BASED_OUTPATIENT_CLINIC_OR_DEPARTMENT_OTHER): Payer: Self-pay

## 2022-02-02 DIAGNOSIS — F4323 Adjustment disorder with mixed anxiety and depressed mood: Secondary | ICD-10-CM | POA: Diagnosis not present

## 2022-02-02 MED ORDER — AMPHETAMINE-DEXTROAMPHET ER 20 MG PO CP24
ORAL_CAPSULE | ORAL | 0 refills | Status: AC
  Filled 2022-02-02: qty 60, 30d supply, fill #0

## 2022-02-10 NOTE — Progress Notes (Signed)
? ?Assessment/Plan:  ? ? ?Alicia Buchanan is a 61 y.o. year old female with risk factors including  age, hypertension, hyperlipidemia, ADHD (predominantly inattentive type), vitamin D deficiency, tobacco abuse, mild hearing loss, history of biliary cirrhosis, depression and  seen today for evaluation of memory loss.  MoCA today is 27/30, with deficiencies in attention. 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 Cerebral volume was age appropriate.  Neuro psych evaluation suspicious for attention or concentration deficit rather than memory loss due to dementia. ? ? ?Subjective Memory Loss ?Attention or Concentration Deficit  ? ? Recommendations:  ? ?Discussed safety both in and out of the home.  ?Discussed the importance of regular daily schedule with inclusion of crossword puzzles to maintain brain function.  ?Continue to monitor mood by PCP ?Stay active at least 30 minutes at least 3 times a week.  ?Naps should be scheduled and should be no longer than 60 minutes and should not occur after 2 PM.  ?Mediterranean diet is recommended  ?Control cardiovascular risk factors  ?No antidementia medication is indicated. ?Follow up as needed ? ? ?Case discussed with Dr. Karel Jarvis who agrees with the plan ? ? ? ? ?Subjective:  ? ? ?Alicia Buchanan is a very pleasant 61 y.o. RH female  seen today in follow up for memory loss. This patient is here alone.Previous records as well as any outside records available were reviewed prior to todays visit.  Patient was last seen at our office on 07/30/21  at which time her MoCa was 27/30.  ?  ? ?Any changes in memory since last visit? Better, "huge sense of relief that I don't have dementia!" ?Patient lives with: Spouse   ?repeats oneself? Less than before "since I know I don't have dementia".  ?Disoriented when walking into  a room?  Patient denies   ?Leaving objects in unusual places?  Patient denies   ?Ambulates  with difficulty?   Patient denies   ?Recent falls?  Patient denies   ?Any head injuries?  Patient denies   ?History of seizures?   Patient denies   ?Wandering behavior?  Patient denies   ?Patient drives? Endorsed. does not get lost. ?Any mood changes such irritability agitation?  Patient  endorses occasional mood changes but feeling better from prior visit.  ?Any depression?:  Endorsed, controlled with fluoxetine; a "lot going on in my personal life" which creates day-to-day stress.    ?Hallucinations?  Patient denies   ?Paranoia?  Patient denies   ?Patient reports that he sleeps well without vivid dreams, REM behavior or sleepwalking  Sleeps about 5 hours, typical in nature.  ?History of sleep apnea?  Patient denies   ?Any hygiene concerns?  Patient denies   ?Independent of bathing and dressing?  Endorsed  ?Does the patient needs help with medications? Denies  ?Who is in charge of the finances?  Patient is in charge  ?Any changes in appetite?  Patient denies   ?Patient have trouble swallowing? Patient denies   ?Does the patient cook?  Patient denies   ?Any kitchen accidents such as leaving the stove on? Patient denies   ?Any headaches?  Patient denies   ?The double vision? Patient denies   ?Any focal numbness or tingling?  Patient denies   ?Chronic back pain Patient denies   ?Unilateral weakness?  Patient denies   ?Any tremors?  Patient  denies   ?Any history of anosmia?  Patient denies   ?Any incontinence of urine?  Patient denies   ?Any bowel dysfunction?   Patient denies    ? ?Initial Visit 07/30/21 The patient is seen in neurologic consultation at the request of Mila PalmerWolters, Sharon, MD for the evaluation of memory.  The patient is here alone.  This is a 61 y.o. year old female who reports "nothing is disruptive, I do not asked the same questions or tell the same stories ".  "My mother and other members of my family have  Alzheimer's dementia, and it worries me, when I go to a room and I cannot remember what I went to do there, or I give my dogs the wrong name ".  She does state that her job in [employer] at times can become stressful.  She does have a history of depression, managed with several medications, without recent changes in them.  Denies irritability.  She lives with her wife [name], who has not noticed this changes.  She has mild insomnia, but denies any vivid dreams, REM behavior or sleepwalking.  Denies hallucinations or paranoia, or leaving objects in unusual places.  Independent of bathing and dressing, medications and finances.  She is trying to modify her lifestyle with inclusion of diet and exercise, having lost 17 pounds since.  She admits to not drinking enough water.  Denies trouble swallowing.  She cooks without leaving the stove or the faucet on.  She ambulates without difficulty, without the use of a walker or a cane, she walks frequently, denying any falls, or head injuries.  She drives, denies being disoriented while driving.  She is hard of hearing, and is in the process of getting new hearing aids. ?Denies headaches, double vision, dizziness, focal numbness or tingling, unilateral weakness or tremors. Denies urine incontinence or retention. Denies constipation or diarrhea. Denies anosmia. Denies history of OSA, she drinks 3-4 d a week  1-2 wine glasses, she smokes half to 1 pack a day of tobacco for at least 30 years.  Family history remarkable for maternal grandfather, mother, and maternal uncle all with Alzheimer's disease. ?Neurocognitive testing , Dr. Milbert CoulterMerz 01/07/22 Briefly, results suggested neuropsychological functioning within normal limits relative to age-matched peers. Isolated weaknesses were exhibited across some aspects of cognitive flexibility, as well as when differentiating between words that were presented as a part of a list learning task. During testing, Ms. Pohlmann was noted to become very  distressed when having trouble with visual scanning across a motorized cognitive flexibility task (TMT B). This escalated to the extent that she became tearful and was actively weeping for a short period of time. She was able to collect herself well. Importantly, her isolated relative weakness surrounding cognitive flexibility was across tasks administered during this brief period of heightened anxiety. This would suggest that test scores were influenced by emotions and acute stressors, especially as other performances surrounding executive functioning were appropriate. They could also be caused by her history of underlying attentional dysregulation due to ADHD. Based upon Ms. Yeagle's current medication list, she is on several medications with known cognitive side effects (i.e., alprazolam/Xanax, oxycodone/Percocet, and zolpidem/Ambien). If she takes these medications regularly, this could contribute to brain fog and mild memory/attentional lapses in her day-to-day life. If desired, she could discuss medication alternatives with her prescribing physician.  ?   ?Labs in October 2022 ?B12 10/5   354 ?TSH 1.18 ?CBC   WBC 12, dehydration, inflammatory process from biliary cirrhosis ?  AlP 148  ? ?MRI brain 10/28/221. 6-7 mm round CSF intensity focus within the anterior right temporal lobe white matter. 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 However, given the mild surrounding T2 FLAIR hyperintense signal abnormality, a 3-6 month follow-up brain MRI with contrast is recommended to ensure stability and to exclude alternative etiologies (such as cystic neoplasm). 2. Elsewhere within the cerebral white matter, there is moderate multifocal T2 FLAIR hyperintense signal abnormality, which is advanced for age. These signal changes are nonspecific and differential considerations include chronic small vessel ischemic disease, a demyelinating process (such as multiple sclerosis) or sequela of a prior  infectious/inflammatory process, among others. 3. Otherwise unremarkable MRI appearance of the brain for age. ?  ?Past Medical History:  ?Diagnosis Date  ? Abnormal levels of other serum enzymes 01/06/2022  ? Acquired hearing los

## 2022-02-11 ENCOUNTER — Encounter: Payer: Self-pay | Admitting: Physician Assistant

## 2022-02-11 ENCOUNTER — Ambulatory Visit (INDEPENDENT_AMBULATORY_CARE_PROVIDER_SITE_OTHER): Payer: BC Managed Care – PPO | Admitting: Physician Assistant

## 2022-02-11 VITALS — BP 119/77 | HR 83 | Resp 18 | Ht 65.0 in | Wt 199.0 lb

## 2022-02-11 DIAGNOSIS — R41840 Attention and concentration deficit: Secondary | ICD-10-CM | POA: Diagnosis not present

## 2022-02-11 DIAGNOSIS — F411 Generalized anxiety disorder: Secondary | ICD-10-CM | POA: Diagnosis not present

## 2022-02-11 NOTE — Patient Instructions (Signed)
It was a pleasure to see you today at our office.  ? ?Recommendations: ? ?Make sure that we control the ADD/ADHD/behavioral issues  ?Follow up as needed ? ?RECOMMENDATIONS FOR ALL PATIENTS WITH MEMORY PROBLEMS: ?1. Continue to exercise (Recommend 30 minutes of walking everyday, or 3 hours every week) ?2. Increase social interactions - continue going to [place] and enjoy social gatherings with friends and family ?3. Eat healthy, avoid fried foods and eat more fruits and vegetables ?4. Maintain adequate blood pressure, blood sugar, and blood cholesterol level. Reducing the risk of stroke and cardiovascular disease also helps promoting better memory. ?5. Avoid stressful situations. Live a simple life and avoid aggravations. Organize your time and prepare for the next day in anticipation. ?6. Sleep well, avoid any interruptions of sleep and avoid any distractions in the bedroom that may interfere with adequate sleep quality ?7. Avoid sugar, avoid sweets as there is a strong link between excessive sugar intake, diabetes, and cognitive impairment ?We discussed the Mediterranean diet, which has been shown to help patients reduce the risk of progressive memory disorders and reduces cardiovascular risk. This includes eating fish, eat fruits and green leafy vegetables, nuts like almonds and hazelnuts, walnuts, and also use olive oil. Avoid fast foods and fried foods as much as possible. Avoid sweets and sugar as sugar use has been linked to worsening of memory function. ? ?There is always a concern of gradual progression of memory problems. If this is the case, then we may need to adjust level of care according to patient needs. Support, both to the patient and caregiver, should then be put into place.  ? ? ? ? ?You have been referred for a neuropsychological evaluation (i.e., evaluation of memory and thinking abilities). Please bring someone with you to this appointment if possible, as it is helpful for the doctor to hear  from both you and another adult who knows you well. Please bring eyeglasses and hearing aids if you wear them.  ?  ?The evaluation will take approximately 3 hours and has two parts: ?  ?The first part is a clinical interview with the neuropsychologist (Dr. Milbert Coulter or Dr. Roseanne Reno). During the interview, the neuropsychologist will speak with you and the individual you brought to the appointment.  ?  ?The second part of the evaluation is testing with the doctor's technician Annabelle Harman or Selena Batten). During the testing, the technician will ask you to remember different types of material, solve problems, and answer some questionnaires. Your family member will not be present for this portion of the evaluation. ?  ?Please note: We must reserve several hours of the neuropsychologist's time and the psychometrician's time for your evaluation appointment. As such, there is a No-Show fee of $100. If you are unable to attend any of your appointments, please contact our office as soon as possible to reschedule.  ? ? ?FALL PRECAUTIONS: Be cautious when walking. Scan the area for obstacles that may increase the risk of trips and falls. When getting up in the mornings, sit up at the edge of the bed for a few minutes before getting out of bed. Consider elevating the bed at the head end to avoid drop of blood pressure when getting up. Walk always in a well-lit room (use night lights in the walls). Avoid area rugs or power cords from appliances in the middle of the walkways. Use a walker or a cane if necessary and consider physical therapy for balance exercise. Get your eyesight checked regularly. ? ?FINANCIAL OVERSIGHT:  Supervision, especially oversight when making financial decisions or transactions is also recommended. ? ?HOME SAFETY: Consider the safety of the kitchen when operating appliances like stoves, microwave oven, and blender. Consider having supervision and share cooking responsibilities until no longer able to participate in those.  Accidents with firearms and other hazards in the house should be identified and addressed as well. ? ? ?ABILITY TO BE LEFT ALONE: If patient is unable to contact 911 operator, consider using LifeLine, or when the need is there, arrange for someone to stay with patients. Smoking is a fire hazard, consider supervision or cessation. Risk of wandering should be assessed by caregiver and if detected at any point, supervision and safe proof recommendations should be instituted. ? ?MEDICATION SUPERVISION: Inability to self-administer medication needs to be constantly addressed. Implement a mechanism to ensure safe administration of the medications. ? ? ?DRIVING: Regarding driving, in patients with progressive memory problems, driving will be impaired. We advise to have someone else do the driving if trouble finding directions or if minor accidents are reported. Independent driving assessment is available to determine safety of driving. ? ? ?If you are interested in the driving assessment, you can contact the following: ? ?[facility] [employer] [facility] in [place] ? ?[place] [phone] ? ?[facility] [phone] ? ?[facility] [phone] or [phone] ? ? ? ?Mediterranean Diet ?A Mediterranean diet refers to food and lifestyle choices that are based on the traditions of countries located on the [employer]. This way of eating has been shown to help prevent certain conditions and improve outcomes for people who have chronic diseases, like kidney disease and heart disease. ?What are tips for following this plan? ?Lifestyle  ?Cook and eat meals together with your family, when possible. ?Drink enough fluid to keep your urine clear or pale yellow. ?Be physically active every day. This includes: ?Aerobic exercise like running or swimming. ?Leisure activities like gardening, walking, or housework. ?Get 7-8 hours of sleep each night. ?If recommended by your health care  provider, drink red wine in moderation. This means 1 glass a day for nonpregnant women and 2 glasses a day for men. A glass of wine equals 5 oz (150 mL). ?Reading food labels  ?Check the serving size of packaged foods. For foods such as rice and pasta, the serving size refers to the amount of cooked product, not dry. ?Check the total fat in packaged foods. Avoid foods that have saturated fat or trans fats. ?Check the ingredients list for added sugars, such as corn syrup. ?Shopping  ?At the grocery store, buy most of your food from the areas near the walls of the store. This includes: ?Fresh fruits and vegetables (produce). ?Grains, beans, nuts, and seeds. Some of these may be available in unpackaged forms or large amounts (in bulk). ?Fresh seafood. ?Poultry and eggs. ?Low-fat dairy products. ?Buy whole ingredients instead of prepackaged foods. ?Buy fresh fruits and vegetables in-season from local farmers markets. ?Buy frozen fruits and vegetables in resealable bags. ?If you do not have access to quality fresh seafood, buy precooked frozen shrimp or canned fish, such as tuna, salmon, or sardines. ?Buy small amounts of raw or cooked vegetables, salads, or olives from the deli or salad bar at your store. ?Stock your pantry so you always have certain foods on hand, such as olive oil, canned tuna, canned tomatoes, rice, pasta, and beans. ?Cooking  ?Cook foods with extra-virgin olive oil instead of using butter or other vegetable oils. ?Have meat as a  side dish, and have vegetables or grains as your main dish. This means having meat in small portions or adding small amounts of meat to foods like pasta or stew. ?Use beans or vegetables instead of meat in common dishes like chili or lasagna. ?Experiment with different cooking methods. Try roasting or broiling vegetables instead of steaming or saut?eing them. ?Add frozen vegetables to soups, stews, pasta, or rice. ?Add nuts or seeds for added healthy fat at each meal. You  can add these to yogurt, salads, or vegetable dishes. ?Marinate fish or vegetables using olive oil, lemon juice, garlic, and fresh herbs. ?Meal planning  ?Plan to eat 1 vegetarian meal one day each week. Try to

## 2022-02-17 DIAGNOSIS — I10 Essential (primary) hypertension: Secondary | ICD-10-CM | POA: Diagnosis not present

## 2022-03-02 DIAGNOSIS — F4323 Adjustment disorder with mixed anxiety and depressed mood: Secondary | ICD-10-CM | POA: Diagnosis not present

## 2022-03-05 ENCOUNTER — Other Ambulatory Visit (HOSPITAL_BASED_OUTPATIENT_CLINIC_OR_DEPARTMENT_OTHER): Payer: Self-pay

## 2022-03-05 MED ORDER — AMPHETAMINE-DEXTROAMPHET ER 20 MG PO CP24
ORAL_CAPSULE | ORAL | 0 refills | Status: AC
  Filled 2022-03-05: qty 60, 30d supply, fill #0

## 2022-03-08 DIAGNOSIS — R7303 Prediabetes: Secondary | ICD-10-CM | POA: Diagnosis not present

## 2022-03-08 DIAGNOSIS — E7800 Pure hypercholesterolemia, unspecified: Secondary | ICD-10-CM | POA: Diagnosis not present

## 2022-03-08 DIAGNOSIS — E559 Vitamin D deficiency, unspecified: Secondary | ICD-10-CM | POA: Diagnosis not present

## 2022-03-08 DIAGNOSIS — K743 Primary biliary cirrhosis: Secondary | ICD-10-CM | POA: Diagnosis not present

## 2022-03-09 ENCOUNTER — Encounter: Payer: Self-pay | Admitting: Behavioral Health

## 2022-03-09 ENCOUNTER — Ambulatory Visit (INDEPENDENT_AMBULATORY_CARE_PROVIDER_SITE_OTHER): Payer: BC Managed Care – PPO | Admitting: Behavioral Health

## 2022-03-09 VITALS — BP 143/87 | HR 104 | Ht 65.5 in | Wt 197.0 lb

## 2022-03-09 DIAGNOSIS — F3341 Major depressive disorder, recurrent, in partial remission: Secondary | ICD-10-CM | POA: Diagnosis not present

## 2022-03-09 DIAGNOSIS — F411 Generalized anxiety disorder: Secondary | ICD-10-CM

## 2022-03-09 NOTE — Progress Notes (Signed)
[facility] MD/PA/NP Initial Note  03/09/2022 5:54 PM Mackinley Cassaday  MRN:  [id number]  Chief Complaint:  Chief Complaint   Family Problem; Establish Care; Patient Education; Advice Only; Medication Refill      HPI:   "Alicia Buchanan", 61 year old female presents to this office for initial visit and to establish care. She says that she was referred by Dr. Paulino Rily, MD. She says that she wanted to establish care with psychiatry to manage medication and to address any changes that may present. She and her wife have been having some relationship challenges with personality differences but expresses she is in a happy relationship overall. She wanted to discuss whether she may need to change her medications or adjust. She says over the last two weeks, her depression is 0/10 and and anxiety is 0/10. She feels like her medication are working well. She understands that medication will not fix everything and agrees that she and her wife need to continue in counseling for now. She says that she was diagnosed with ADHD about 10 years ago but symptoms are well controlled. Says she is sleeping  about  7-8 hours per night. She denies any mania, no psychosis, no SI/HI. She is working 40 per week for [employer]. She and her wife are raising an adopted 40 year old boy. She recently had work up at [facility] and and showed some small vascular changes but nothing to suggest demential or impaired memory. She says this was very satisfying since dementia runs in her family. She agrees that she would like to continue her current medication regimen and reevaluate in 6 weeks.   No prior psychiatric medications  Visit Diagnosis:    ICD-10-CM   1. Recurrent major depressive disorder, in partial remission (HCC)  F33.41     2. Generalized anxiety disorder  F41.1       Past Psychiatric History: ADHD, Anxiety, MDD  Past Medical History:  Past Medical History:  Diagnosis Date   Abnormal levels of other serum enzymes  01/06/2022   Acquired hearing loss    ADHD (attention deficit hyperactivity disorder), inattentive type    Chronic otitis externa of both ears 02/25/2017   Colitis    Crohn's disease    Essential hypertension    Family history of Alzheimer's disease 07/30/2021   Generalized anxiety disorder    Hypercholesteremia    Hyperglycemia    Hypertension    Insomnia    Lateral epicondylitis 03/03/2018   Major depressive disorder    Pain in joint of left elbow 03/03/2018   Pain in joint of right elbow 12/02/2020   Prediabetes    Primary biliary cirrhosis    Pure hypercholesterolemia    Sciatica    Tear of distal tendon of biceps 02/10/2021   Tobacco use    Vitamin D deficiency     Past Surgical History:  Procedure Laterality Date   KNEE SURGERY     LAPAROSCOPIC APPENDECTOMY  09/10/2012   Procedure: APPENDECTOMY LAPAROSCOPIC;  Surgeon: Valarie Merino, MD;  Location: [facility];  Service: General;  Laterality: N/A;   TONSILLECTOMY      Family Psychiatric History: see chart  Family History:  Family History  Problem Relation Age of Onset   Breast cancer Mother    Alzheimer's disease Mother        onset in early to mid 26s   Alcohol abuse Father    Depression Brother    ADD / ADHD Brother    Drug abuse Brother  Alcohol abuse Brother    Suicidality Maternal Aunt    Dementia Maternal Uncle    Alzheimer's disease Maternal Uncle        onset in early to mid 7s   Dementia Maternal Grandfather    Alzheimer's disease Maternal Grandfather    Bipolar disorder Maternal Grandmother     Social History:  Social History   Socioeconomic History   Marital status: Significant Other    Spouse name: Rosey Bath Call   Number of children: 1   Years of education: 13   Highest education level: Some college, no degree  Occupational History   Occupation: HR  Tobacco Use   Smoking status: Every Day    Packs/day: 1.00    Years: 40.00    Pack years: 40.00    Types: Cigarettes   Smokeless  tobacco: Never  Vaping Use   Vaping Use: Never used  Substance and Sexual Activity   Alcohol use: Yes    Alcohol/week: 3.0 - 9.0 standard drinks    Types: 3 - 9 Standard drinks or equivalent per week    Comment: 1-3 drinks per week   Drug use: No   Sexual activity: Yes    Birth control/protection: None, Post-menopausal  Other Topics Concern   Not on file  Social History Narrative   Lives in [place] with wife and 54 year old son living in home. Loves  golf in free time. Enjoys car shows.       Right handed   Two story home   Drinks caffeine   Social Determinants of Health   Financial Resource Strain: Not on file  Food Insecurity: Not on file  Transportation Needs: Not on file  Physical Activity: Not on file  Stress: Not on file  Social Connections: Not on file    Allergies:  Allergies  Allergen Reactions   Varenicline Other (See Comments)    Metabolic Disorder Labs: No results found for: HGBA1C, MPG No results found for: PROLACTIN No results found for: CHOL, TRIG, HDL, CHOLHDL, VLDL, LDLCALC No results found for: TSH  Therapeutic Level Labs: No results found for: LITHIUM No results found for: VALPROATE No components found for:  CBMZ  Current Medications: Current Outpatient Medications  Medication Sig Dispense Refill   ALPRAZolam (XANAX) 0.5 MG tablet Take 0.5 mg by mouth at bedtime as needed. ANXIETY     amphetamine-dextroamphetamine (ADDERALL XR) 20 MG 24 hr capsule 2-3 capsules in the morning Orally Once a day 30 days 60 capsule 0   amphetamine-dextroamphetamine (ADDERALL XR) 20 MG 24 hr capsule 2-3 capsules Orally Once a day 30 days 90 capsule 0   amphetamine-dextroamphetamine (ADDERALL XR) 20 MG 24 hr capsule 2-3 capsules Orally Once a day 30 days 60 capsule 0   amphetamine-dextroamphetamine (ADDERALL XR) 20 MG 24 hr capsule 2-3 capsules Orally Once a day 30 days 60 capsule 0   amphetamine-dextroamphetamine (ADDERALL) 20 MG tablet      cloNIDine  (CATAPRES) 0.1 MG tablet Take 0.1 mg by mouth at bedtime.     desvenlafaxine (PRISTIQ) 50 MG 24 hr tablet Take 50 mg by mouth daily.     FLUoxetine (PROZAC) 40 MG capsule Take 40 mg by mouth daily.     ibuprofen (ADVIL,MOTRIN) 200 MG tablet Take 400 mg by mouth every 6 (six) hours as needed. Pain     lisinopril (ZESTRIL) 10 MG tablet      nebivolol (BYSTOLIC) 10 MG tablet Take 10 mg by mouth daily.     oxyCODONE-acetaminophen (PERCOCET/ROXICET)  5-325 MG per tablet Take 1-2 tablets by mouth every 4 (four) hours as needed. Pain     oxyCODONE-acetaminophen (PERCOCET/ROXICET) 5-325 MG per tablet Take 2 tablets by mouth every 4 (four) hours as needed for pain. 15 tablet 0   rosuvastatin (CRESTOR) 10 MG tablet Take 10 mg by mouth daily.     ursodiol (ACTIGALL) 300 MG capsule Take 600 mg by mouth 2 (two) times daily. TAKE TWO TWICE DAILY     varenicline (CHANTIX) 0.5 MG tablet Take 0.5 mg by mouth 2 (two) times daily.     zolpidem (AMBIEN) 5 MG tablet Take 5 mg by mouth at bedtime as needed.     No current facility-administered medications for this visit.    Medication Side Effects: none  Orders placed this visit:  No orders of the defined types were placed in this encounter.   Psychiatric Specialty Exam:  Review of Systems  Blood pressure (!) 143/87, pulse (!) 104, height 5' 5.5" (1.664 m), weight 197 lb (89.4 kg), last menstrual period 10/18/2010.Body mass index is 32.28 kg/m.  General Appearance: Casual, Neat, and Well Groomed  Eye Contact:  Good  Speech:  Clear and Coherent  Volume:  Normal  Mood:  NA  Affect:  Appropriate  Thought Process:  Coherent  Orientation:  Full (Time, Place, and Person)  Thought Content: Logical   Suicidal Thoughts:  No  Homicidal Thoughts:  No  Memory:  WNL  Judgement:  Good  Insight:  Good  Psychomotor Activity:  Normal  Concentration:  Concentration: Good  Recall:  Good  Fund of Knowledge: Good  Language: Good  Assets:  Desire for Improvement   ADL's:  Intact  Cognition: WNL  Prognosis:  Good   Screenings:  PHQ2-9    Flowsheet Row Office Visit from 03/09/2022 in [facility]  PHQ-2 Total Score 0       Receiving Psychotherapy: No   Treatment Plan/Recommendations:    Greater than 50% of  60 min face to face time with patient was spent on counseling and coordination of care. We discussed her hx of ADHD and current family dynamics. We assessed her for current depression and anxiety. Some situational family stress was noted but nothing significant was reported at this time to warrant changes to current medication We agreed to reassess in 6 weeks. Pt will continue in therapy with spouse.   We agreed: To continue Adderall 20 mg XR 2-3 capsules per day. To continue Prozac 40 mg capsule daily To report worsening symptoms promptly Will continue in psychotherapy To follow up in 6 weeks to reassess Provided emergency contact information Discussed potential benefits, risks, and side effects of stimulants with patient to include increased heart rate, palpitations, insomnia, increased anxiety, increased irritability, or decreased appetite.  Instructed patient to contact office if experiencing any significant tolerability issues.  Reviewed PDMP     Joan FloresBrian A Maesyn Frisinger, NP

## 2022-03-10 DIAGNOSIS — K743 Primary biliary cirrhosis: Secondary | ICD-10-CM | POA: Diagnosis not present

## 2022-03-10 DIAGNOSIS — R7303 Prediabetes: Secondary | ICD-10-CM | POA: Diagnosis not present

## 2022-03-10 DIAGNOSIS — E7800 Pure hypercholesterolemia, unspecified: Secondary | ICD-10-CM | POA: Diagnosis not present

## 2022-03-10 DIAGNOSIS — E559 Vitamin D deficiency, unspecified: Secondary | ICD-10-CM | POA: Diagnosis not present

## 2022-03-16 DIAGNOSIS — F4323 Adjustment disorder with mixed anxiety and depressed mood: Secondary | ICD-10-CM | POA: Diagnosis not present

## 2022-03-24 DIAGNOSIS — F4323 Adjustment disorder with mixed anxiety and depressed mood: Secondary | ICD-10-CM | POA: Diagnosis not present

## 2022-03-31 DIAGNOSIS — F4323 Adjustment disorder with mixed anxiety and depressed mood: Secondary | ICD-10-CM | POA: Diagnosis not present

## 2022-04-06 ENCOUNTER — Other Ambulatory Visit (HOSPITAL_BASED_OUTPATIENT_CLINIC_OR_DEPARTMENT_OTHER): Payer: Self-pay

## 2022-04-06 MED ORDER — AMPHETAMINE-DEXTROAMPHET ER 20 MG PO CP24
ORAL_CAPSULE | ORAL | 0 refills | Status: AC
  Filled 2022-04-06: qty 60, 30d supply, fill #0

## 2022-04-06 MED ORDER — AMPHETAMINE-DEXTROAMPHET ER 20 MG PO CP24
ORAL_CAPSULE | ORAL | 0 refills | Status: DC
  Filled 2022-06-07: qty 60, 30d supply, fill #0

## 2022-04-06 MED ORDER — AMPHETAMINE-DEXTROAMPHET ER 20 MG PO CP24
ORAL_CAPSULE | ORAL | 0 refills | Status: AC
  Filled 2022-05-07: qty 60, 30d supply, fill #0

## 2022-04-27 ENCOUNTER — Ambulatory Visit: Payer: BC Managed Care – PPO | Admitting: Behavioral Health

## 2022-05-04 DIAGNOSIS — F4323 Adjustment disorder with mixed anxiety and depressed mood: Secondary | ICD-10-CM | POA: Diagnosis not present

## 2022-05-07 ENCOUNTER — Other Ambulatory Visit (HOSPITAL_BASED_OUTPATIENT_CLINIC_OR_DEPARTMENT_OTHER): Payer: Self-pay

## 2022-05-19 DIAGNOSIS — F4323 Adjustment disorder with mixed anxiety and depressed mood: Secondary | ICD-10-CM | POA: Diagnosis not present

## 2022-06-04 DIAGNOSIS — F4323 Adjustment disorder with mixed anxiety and depressed mood: Secondary | ICD-10-CM | POA: Diagnosis not present

## 2022-06-07 ENCOUNTER — Other Ambulatory Visit (HOSPITAL_BASED_OUTPATIENT_CLINIC_OR_DEPARTMENT_OTHER): Payer: Self-pay

## 2022-06-09 DIAGNOSIS — F4323 Adjustment disorder with mixed anxiety and depressed mood: Secondary | ICD-10-CM | POA: Diagnosis not present

## 2022-06-24 IMAGING — MR MR HEAD WO/W CM
13 series · 48 of 48 positions shown · IV contrast (multihance)
Comparison: No pertinent prior exams available for comparison.

CLINICAL DATA: Memory loss. Additional history provided by scanning
technologist: Patient reports memory loss for 4-6 months, family
history of Alzheimer's disease.

EXAM:
MRI HEAD WITHOUT AND WITH CONTRAST
TECHNIQUE: Multiplanar, multiecho pulse sequences of the brain and surrounding
structures were obtained without and with intravenous contrast.
CONTRAST:  19mL MULTIHANCE GADOBENATE DIMEGLUMINE 529 MG/ML IV SOLN

[Series 2: T1 · sagittal · 5.0mm · 0.45mm/px · 1 of 21 slices shown]
[im 1/21]
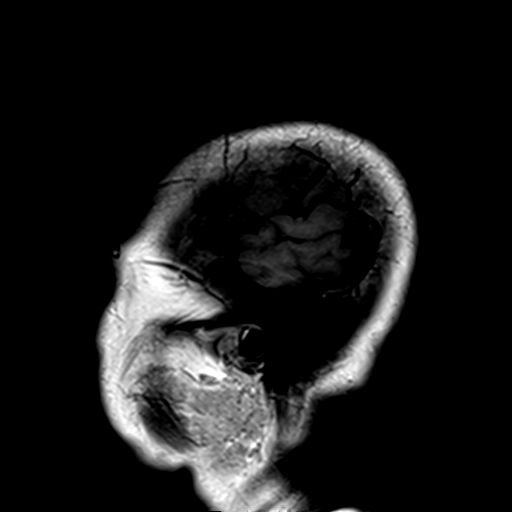

[Series 3: DWI · axial · 3.0mm · 1.80mm/px · z∈[-66,+79]mm · 7 of 100 slices shown (1 of 4)]
[im 1/100]
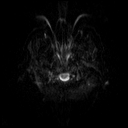
[im 17/100]
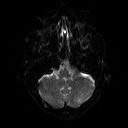
[im 34/100]
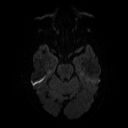
[im 50/100]
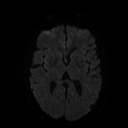
[im 67/100]
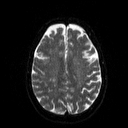
[im 83/100]
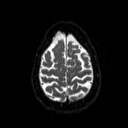
[im 100/100]
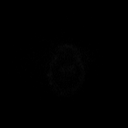

[Series 4: DWI · axial · 3.0mm · 1.80mm/px · z∈[-66,+79]mm · 3 of 46 slices shown (2 of 4)]
[im 1/46]
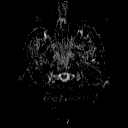
[im 23/46]
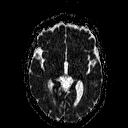
[im 46/46]
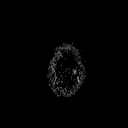

[Series 5: DWI · coronal · 5.0mm · 1.80mm/px · 4 of 68 slices shown (3 of 4)]
[im 1/68]
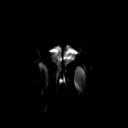
[im 23/68]
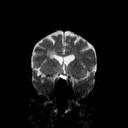
[im 45/68]
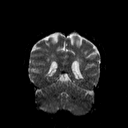
[im 68/68]
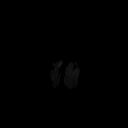

[Series 6: DWI · coronal · 5.0mm · 1.80mm/px · 2 of 34 slices shown (4 of 4)]
[im 1/34]
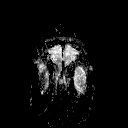
[im 34/34]
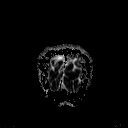

[Series 7: T2 · axial · 5.0mm · 0.60mm/px · 1 of 22 slices shown (1 of 2)]
[im 1/22]
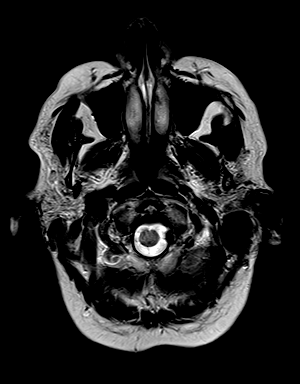

[Series 8: FLAIR · axial · 3.0mm · 0.45mm/px · z∈[-67,+80]mm · 2 of 33 slices shown (1 of 2)]
[im 1/33]
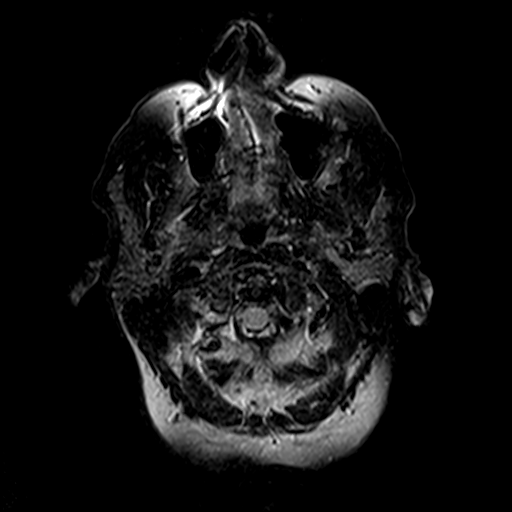
[im 33/33]
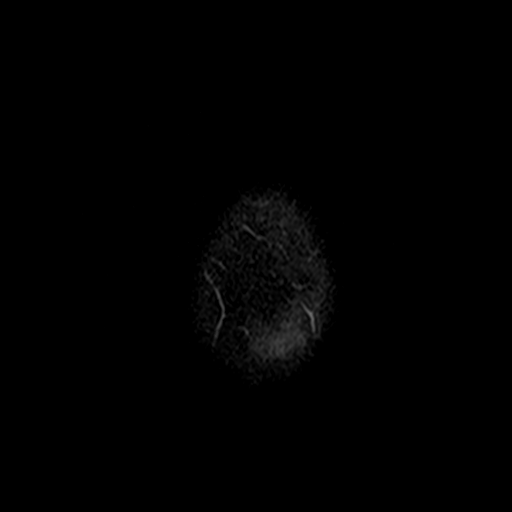

[Series 10: swi_images · axial · 4.0mm · 0.90mm/px · z∈[-63,+76]mm · 2 of 36 slices shown]
[im 1/36]
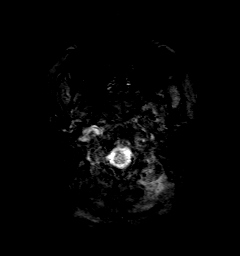
[im 36/36]
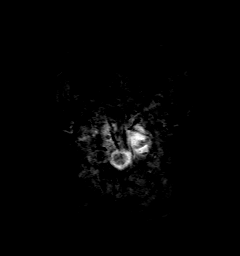

[Series 11: FLAIR · sagittal · 5.0mm · 0.45mm/px · 2 of 25 slices shown (2 of 2)]
[im 1/25]
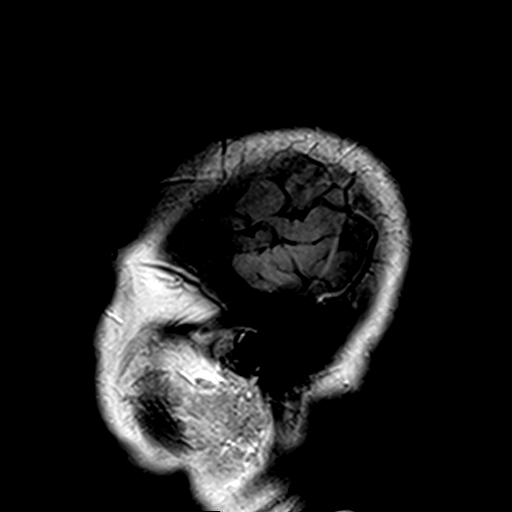
[im 25/25]
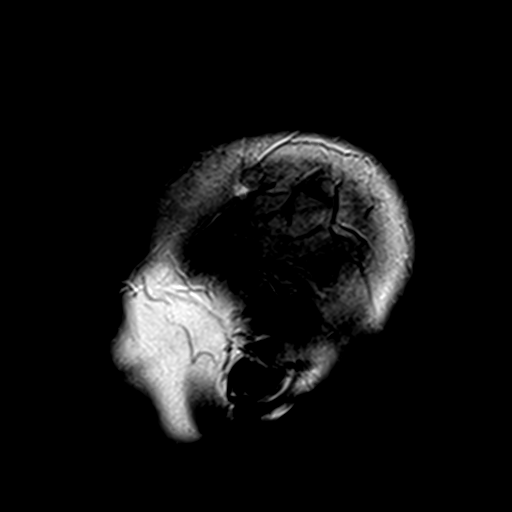

[Series 12: t1_mpr_tra · axial · 1.0mm · 0.75mm/px · z∈[-65,+76]mm · 10 of 144 slices shown]
[im 1/144]
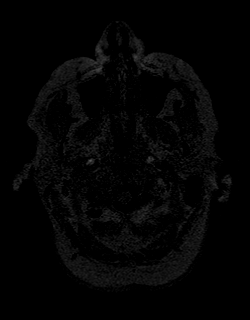
[im 16/144]
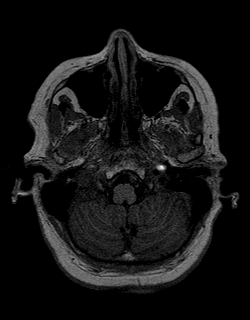
[im 32/144]
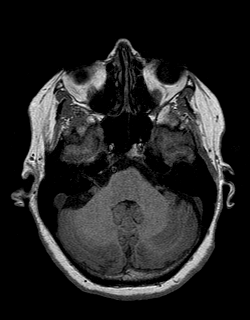
[im 48/144]
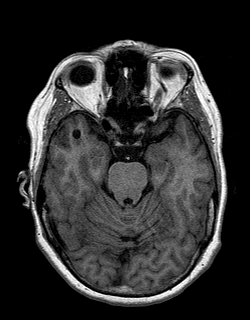
[im 64/144]
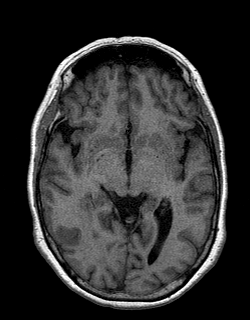
[im 80/144]
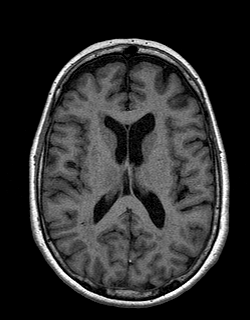
[im 96/144]
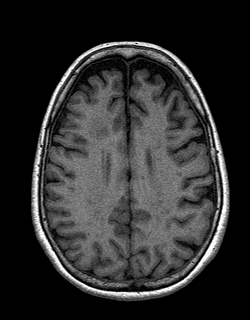
[im 112/144]
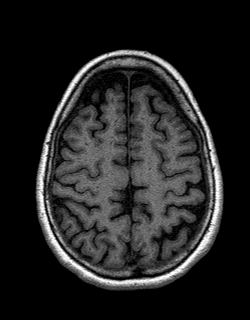
[im 128/144]
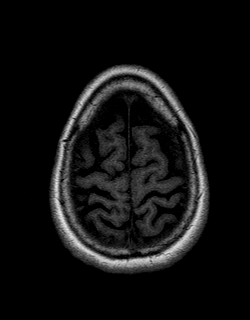
[im 144/144]
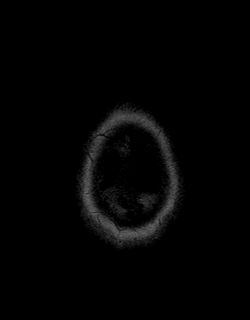

[Series 13: T2 · coronal · 5.0mm · 0.45mm/px · 2 of 27 slices shown (2 of 2)]
[im 1/27]
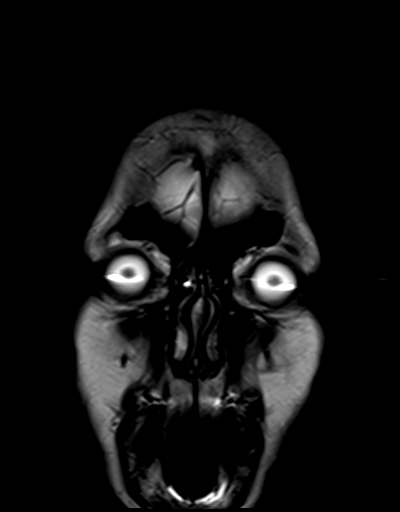
[im 27/27]
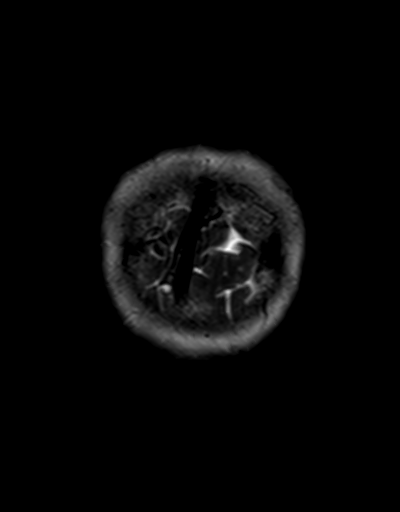

[Series 14: t1_mpr_tra post · axial · 1.0mm · 0.75mm/px · z∈[-65,+76]mm · 10 of 144 slices shown]
[im 1/144]
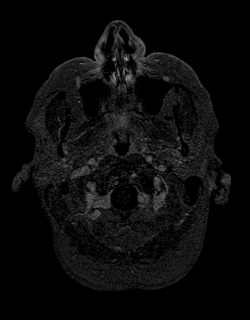
[im 16/144]
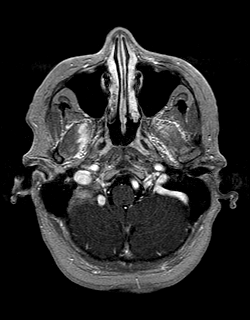
[im 32/144]
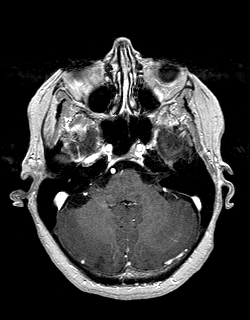
[im 48/144]
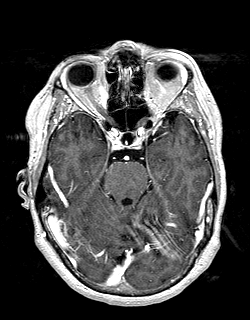
[im 64/144]
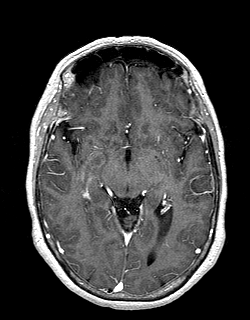
[im 80/144]
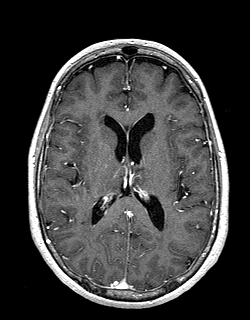
[im 96/144]
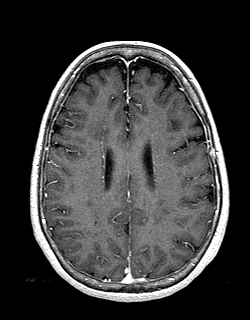
[im 112/144]
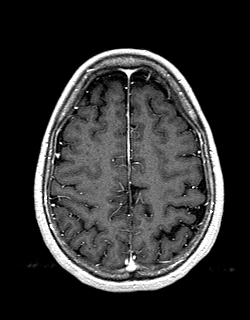
[im 128/144]
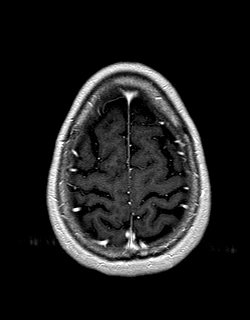
[im 144/144]
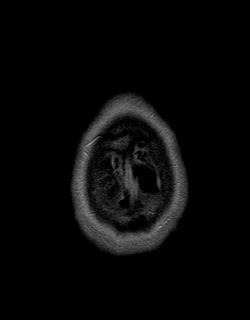

[Series 15: post cor · coronal · 5.0mm · 0.45mm/px · 2 of 27 slices shown]
[im 1/27]
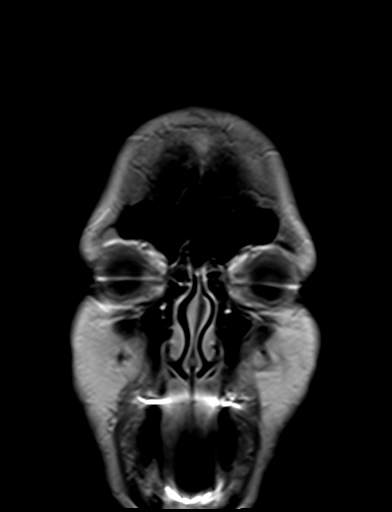
[im 27/27]
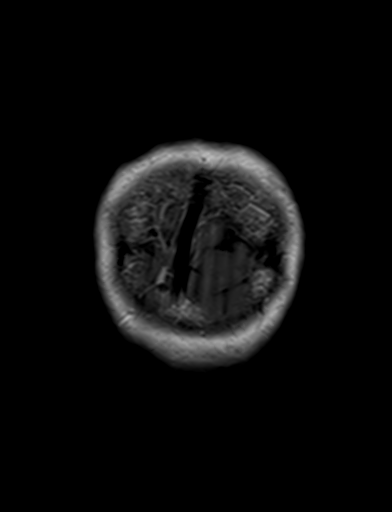

[48 of 48 positions shown; findings below may reference images not displayed]

FINDINGS: Brain:

Cerebral volume appears normal for age.

6-7 mm round CSF intensity focus within the anterior right temporal
lobe white matter. Mild surrounding T2 FLAIR hyperintense signal
abnormality (for instance as seen on series 8, image 12). No
definite associated abnormal enhancement.

Elsewhere within the cerebral white matter, there is moderate
multifocal T2 FLAIR hyperintense signal abnormality, which is
advanced for age. The largest focus of signal abnormality is present
within the right frontal lobe periventricular white matter,
measuring 2.6 cm (series 8, image 21).

There is no acute infarct.

No chronic intracranial blood products.

No extra-axial fluid collection.

No midline shift.

Vascular: Maintained flow voids within the proximal large arterial
vessels.

Skull and upper cervical spine: No focal suspicious marrow lesion.

Sinuses/Orbits: Visualized orbits show no acute finding. Trace
mucosal thickening within the right frontoethmoidal recess.

Impression #1 will be called to the ordering clinician or
representative by the Radiologist Assistant, and communication
documented in the PACS or [REDACTED].
IMPRESSION: 1. 6-7 mm round CSF intensity focus within the anterior right
temporal lobe white matter. This is favored to reflect a benign
neuroglial cyst. However, given the mild surrounding T2 FLAIR
hyperintense signal abnormality, a 3-6 month follow-up brain MRI
with contrast is recommended to ensure stability and to exclude
alternative etiologies (such as cystic neoplasm).
2. Elsewhere within the cerebral white matter, there is moderate
multifocal T2 FLAIR hyperintense signal abnormality, which is
advanced for age. These signal changes are nonspecific and
differential considerations include chronic small vessel ischemic
disease, a demyelinating process (such as multiple sclerosis) or
sequela of a prior infectious/inflammatory process, among others.
3. Otherwise unremarkable MRI appearance of the brain for age.

## 2022-07-08 ENCOUNTER — Other Ambulatory Visit (HOSPITAL_BASED_OUTPATIENT_CLINIC_OR_DEPARTMENT_OTHER): Payer: Self-pay

## 2022-07-09 ENCOUNTER — Other Ambulatory Visit (HOSPITAL_BASED_OUTPATIENT_CLINIC_OR_DEPARTMENT_OTHER): Payer: Self-pay

## 2022-07-12 ENCOUNTER — Other Ambulatory Visit (HOSPITAL_BASED_OUTPATIENT_CLINIC_OR_DEPARTMENT_OTHER): Payer: Self-pay

## 2022-07-12 MED ORDER — AMPHETAMINE-DEXTROAMPHET ER 20 MG PO CP24
40.0000 mg | ORAL_CAPSULE | Freq: Every day | ORAL | 0 refills | Status: DC
  Filled 2022-07-12: qty 60, 30d supply, fill #0

## 2022-08-15 ENCOUNTER — Other Ambulatory Visit (HOSPITAL_BASED_OUTPATIENT_CLINIC_OR_DEPARTMENT_OTHER): Payer: Self-pay

## 2022-08-15 MED ORDER — AMPHETAMINE-DEXTROAMPHET ER 20 MG PO CP24
40.0000 mg | ORAL_CAPSULE | Freq: Every day | ORAL | 0 refills | Status: AC
  Filled 2022-08-15: qty 60, 30d supply, fill #0

## 2022-08-16 ENCOUNTER — Other Ambulatory Visit (HOSPITAL_BASED_OUTPATIENT_CLINIC_OR_DEPARTMENT_OTHER): Payer: Self-pay

## 2022-08-25 ENCOUNTER — Other Ambulatory Visit (HOSPITAL_BASED_OUTPATIENT_CLINIC_OR_DEPARTMENT_OTHER): Payer: Self-pay

## 2022-08-25 DIAGNOSIS — R7303 Prediabetes: Secondary | ICD-10-CM | POA: Diagnosis not present

## 2022-08-25 DIAGNOSIS — E7800 Pure hypercholesterolemia, unspecified: Secondary | ICD-10-CM | POA: Diagnosis not present

## 2022-08-25 DIAGNOSIS — K743 Primary biliary cirrhosis: Secondary | ICD-10-CM | POA: Diagnosis not present

## 2022-08-25 DIAGNOSIS — L409 Psoriasis, unspecified: Secondary | ICD-10-CM | POA: Diagnosis not present

## 2022-08-25 MED ORDER — TRIAMCINOLONE ACETONIDE 0.5 % EX CREA
TOPICAL_CREAM | CUTANEOUS | 2 refills | Status: AC
  Filled 2022-08-25: qty 60, 30d supply, fill #0

## 2022-08-26 ENCOUNTER — Other Ambulatory Visit (HOSPITAL_BASED_OUTPATIENT_CLINIC_OR_DEPARTMENT_OTHER): Payer: Self-pay

## 2022-09-01 ENCOUNTER — Telehealth: Payer: Self-pay | Admitting: *Deleted

## 2022-09-01 NOTE — Patient Outreach (Signed)
  Care Coordination   09/01/2022 Name: Alicia Buchanan MRN: [id number] DOB: 06-14-1961   Care Coordination Outreach Attempts:  An unsuccessful telephone outreach was attempted today to offer the patient information about available care coordination services as a benefit of their health plan.   Follow Up Plan:  Additional outreach attempts will be made to offer the patient care coordination information and services.   Encounter Outcome:  No Answer  Care Coordination Interventions Activated:  No   Care Coordination Interventions:  No, not indicated    Elliot Cousin, RN Care Management Coordinator [facility] Main Office [phone]

## 2022-09-16 ENCOUNTER — Other Ambulatory Visit (HOSPITAL_BASED_OUTPATIENT_CLINIC_OR_DEPARTMENT_OTHER): Payer: Self-pay

## 2022-09-17 ENCOUNTER — Other Ambulatory Visit (HOSPITAL_BASED_OUTPATIENT_CLINIC_OR_DEPARTMENT_OTHER): Payer: Self-pay

## 2022-09-20 ENCOUNTER — Other Ambulatory Visit (HOSPITAL_BASED_OUTPATIENT_CLINIC_OR_DEPARTMENT_OTHER): Payer: Self-pay

## 2022-09-21 DIAGNOSIS — R41840 Attention and concentration deficit: Secondary | ICD-10-CM | POA: Diagnosis not present

## 2022-09-22 ENCOUNTER — Other Ambulatory Visit (HOSPITAL_BASED_OUTPATIENT_CLINIC_OR_DEPARTMENT_OTHER): Payer: Self-pay

## 2022-09-22 MED ORDER — AMPHETAMINE-DEXTROAMPHET ER 20 MG PO CP24: 40.0000 mg | ORAL_CAPSULE | Freq: Every day | ORAL | 0 refills | Status: AC

## 2022-09-22 MED ORDER — AMPHETAMINE-DEXTROAMPHET ER 20 MG PO CP24
40.0000 mg | ORAL_CAPSULE | Freq: Every day | ORAL | 0 refills | Status: AC
  Filled 2022-09-22: qty 60, 30d supply, fill #0

## 2022-09-28 ENCOUNTER — Other Ambulatory Visit (HOSPITAL_BASED_OUTPATIENT_CLINIC_OR_DEPARTMENT_OTHER): Payer: Self-pay

## 2022-09-28 DIAGNOSIS — R41840 Attention and concentration deficit: Secondary | ICD-10-CM | POA: Diagnosis not present

## 2022-09-28 DIAGNOSIS — F419 Anxiety disorder, unspecified: Secondary | ICD-10-CM | POA: Diagnosis not present

## 2022-09-28 DIAGNOSIS — F902 Attention-deficit hyperactivity disorder, combined type: Secondary | ICD-10-CM | POA: Diagnosis not present

## 2022-09-28 MED ORDER — AMPHET-DEXTROAMPHET 3-BEAD ER 50 MG PO CP24
1.0000 | ORAL_CAPSULE | Freq: Every day | ORAL | 0 refills | Status: AC
  Filled 2022-09-28: qty 30, 30d supply, fill #0

## 2022-09-29 ENCOUNTER — Other Ambulatory Visit (HOSPITAL_BASED_OUTPATIENT_CLINIC_OR_DEPARTMENT_OTHER): Payer: Self-pay

## 2022-10-04 ENCOUNTER — Other Ambulatory Visit (HOSPITAL_BASED_OUTPATIENT_CLINIC_OR_DEPARTMENT_OTHER): Payer: Self-pay

## 2022-10-28 DIAGNOSIS — E7800 Pure hypercholesterolemia, unspecified: Secondary | ICD-10-CM | POA: Diagnosis not present

## 2022-10-28 DIAGNOSIS — K743 Primary biliary cirrhosis: Secondary | ICD-10-CM | POA: Diagnosis not present

## 2022-10-28 DIAGNOSIS — L409 Psoriasis, unspecified: Secondary | ICD-10-CM | POA: Diagnosis not present

## 2022-10-28 DIAGNOSIS — R7303 Prediabetes: Secondary | ICD-10-CM | POA: Diagnosis not present

## 2022-11-02 DIAGNOSIS — F4320 Adjustment disorder, unspecified: Secondary | ICD-10-CM | POA: Diagnosis not present

## 2022-11-11 DIAGNOSIS — F4322 Adjustment disorder with anxiety: Secondary | ICD-10-CM | POA: Diagnosis not present

## 2022-11-24 DIAGNOSIS — F4322 Adjustment disorder with anxiety: Secondary | ICD-10-CM | POA: Diagnosis not present

## 2022-11-30 DIAGNOSIS — F4322 Adjustment disorder with anxiety: Secondary | ICD-10-CM | POA: Diagnosis not present

## 2022-12-08 DIAGNOSIS — F4322 Adjustment disorder with anxiety: Secondary | ICD-10-CM | POA: Diagnosis not present

## 2022-12-15 DIAGNOSIS — F4322 Adjustment disorder with anxiety: Secondary | ICD-10-CM | POA: Diagnosis not present

## 2022-12-22 DIAGNOSIS — F4322 Adjustment disorder with anxiety: Secondary | ICD-10-CM | POA: Diagnosis not present

## 2022-12-28 DIAGNOSIS — Z79899 Other long term (current) drug therapy: Secondary | ICD-10-CM | POA: Diagnosis not present

## 2022-12-28 DIAGNOSIS — Z5181 Encounter for therapeutic drug level monitoring: Secondary | ICD-10-CM | POA: Diagnosis not present

## 2022-12-28 DIAGNOSIS — Z79891 Long term (current) use of opiate analgesic: Secondary | ICD-10-CM | POA: Diagnosis not present

## 2022-12-28 DIAGNOSIS — F902 Attention-deficit hyperactivity disorder, combined type: Secondary | ICD-10-CM | POA: Diagnosis not present

## 2022-12-29 DIAGNOSIS — F4322 Adjustment disorder with anxiety: Secondary | ICD-10-CM | POA: Diagnosis not present

## 2023-01-04 DIAGNOSIS — F4322 Adjustment disorder with anxiety: Secondary | ICD-10-CM | POA: Diagnosis not present

## 2023-02-02 DIAGNOSIS — F4322 Adjustment disorder with anxiety: Secondary | ICD-10-CM | POA: Diagnosis not present

## 2023-04-07 DIAGNOSIS — Z79899 Other long term (current) drug therapy: Secondary | ICD-10-CM | POA: Diagnosis not present

## 2023-04-07 DIAGNOSIS — F902 Attention-deficit hyperactivity disorder, combined type: Secondary | ICD-10-CM | POA: Diagnosis not present

## 2023-04-08 DIAGNOSIS — F902 Attention-deficit hyperactivity disorder, combined type: Secondary | ICD-10-CM | POA: Diagnosis not present

## 2023-05-09 DIAGNOSIS — F411 Generalized anxiety disorder: Secondary | ICD-10-CM | POA: Diagnosis not present

## 2023-05-09 DIAGNOSIS — F339 Major depressive disorder, recurrent, unspecified: Secondary | ICD-10-CM | POA: Diagnosis not present

## 2023-06-03 DIAGNOSIS — F339 Major depressive disorder, recurrent, unspecified: Secondary | ICD-10-CM | POA: Diagnosis not present

## 2023-06-03 DIAGNOSIS — K743 Primary biliary cirrhosis: Secondary | ICD-10-CM | POA: Diagnosis not present

## 2023-06-03 DIAGNOSIS — R7303 Prediabetes: Secondary | ICD-10-CM | POA: Diagnosis not present

## 2023-07-22 DIAGNOSIS — F902 Attention-deficit hyperactivity disorder, combined type: Secondary | ICD-10-CM | POA: Diagnosis not present

## 2023-07-22 DIAGNOSIS — Z79899 Other long term (current) drug therapy: Secondary | ICD-10-CM | POA: Diagnosis not present

## 2023-11-24 DIAGNOSIS — F902 Attention-deficit hyperactivity disorder, combined type: Secondary | ICD-10-CM | POA: Diagnosis not present

## 2023-11-24 DIAGNOSIS — Z79899 Other long term (current) drug therapy: Secondary | ICD-10-CM | POA: Diagnosis not present

## 2024-08-28 ENCOUNTER — Ambulatory Visit (INDEPENDENT_AMBULATORY_CARE_PROVIDER_SITE_OTHER): Admitting: Physician Assistant

## 2024-08-28 ENCOUNTER — Encounter: Payer: Self-pay | Admitting: Physician Assistant

## 2024-08-28 VITALS — BP 150/84

## 2024-08-28 DIAGNOSIS — Z7189 Other specified counseling: Secondary | ICD-10-CM | POA: Diagnosis not present

## 2024-08-28 DIAGNOSIS — L409 Psoriasis, unspecified: Secondary | ICD-10-CM

## 2024-08-28 DIAGNOSIS — Z720 Tobacco use: Secondary | ICD-10-CM

## 2024-08-28 MED ORDER — CLOBETASOL PROPIONATE 0.05 % EX OINT: 1.0000 | TOPICAL_OINTMENT | Freq: Two times a day (BID) | CUTANEOUS | 2 refills | Status: AC

## 2024-08-28 MED ORDER — KETOCONAZOLE 2 % EX SHAM: 1.0000 | MEDICATED_SHAMPOO | CUTANEOUS | 4 refills | Status: AC

## 2024-08-28 MED ORDER — CLOBETASOL PROPIONATE 0.05 % EX SOLN: 1.0000 | Freq: Two times a day (BID) | CUTANEOUS | 2 refills | Status: AC

## 2024-08-28 MED ORDER — TRIAMCINOLONE ACETONIDE 0.1 % EX OINT: 1.0000 | TOPICAL_OINTMENT | Freq: Two times a day (BID) | CUTANEOUS | 2 refills | Status: AC

## 2024-08-28 NOTE — Progress Notes (Deleted)
   New Patient Visit   Subjective  Alicia Buchanan is a 63 y.o. female who presents for the following: Psoriasis of elbows, lower legs and scalp - She has used TMC 0.5% cream in the past but it does not help. She has Primary Biliary cirrhosis so her PCP did not want to start any medications for psoriasis.    The following portions of the chart were reviewed this encounter and updated as appropriate: medications, allergies, medical history  Review of Systems:  No other skin or systemic complaints except as noted in HPI or Assessment and Plan.  Objective  Well appearing patient in no apparent distress; mood and affect are within normal limits.  Areas Examined: ***  Relevant exam findings are noted in the Assessment and Plan.      Assessment & Plan   PSORIASIS   Related Procedures Comprehensive metabolic panel CBC with Differential/Platelet Hepatitis B surface antibody,qualitative Hepatitis B surface antigen Hepatitis C antibody QuantiFERON-TB Gold Plus Hepatitis B surface antibody,quantitative TOBACCO USE    PSORIASIS Well-demarcated erythematous papules/plaques with silvery scale, guttate pink scaly papules. 20% BSA.   Patient denies joint pain  Treatment Plan: Clobetasol ointment Apply to affected severe areas twice daily until clear  TMC 0.1% ointment Apply to less severe areas twice daily until clear.  Clobetasol solution Apply to scalp twice daily as needed  Ketoconazole 2% shampoo Wash scalp daily as needed   Discussed biologic treatment. We will check her labs today and discuss biologics and he primary biliary cirrhosis with her PCP before deciding to start treatment.   Counseling on psoriasis and coordination of care  psoriasis is a chronic non-curable, but treatable genetic/hereditary disease that may have other systemic features affecting other organ systems such as joints (Psoriatic Arthritis). It is associated with an increased risk of  inflammatory bowel disease, heart disease, non-alcoholic fatty liver disease, and depression.  Treatments include light and laser treatments; topical medications; and systemic medications including oral and injectables.    Return in about 4 months (around 12/26/2024) for Psoriasis.  I, Roseline Hutchinson, CMA, am acting as scribe for SANDRIDGE,BRENDA K, PA-C .   Documentation: I have reviewed the above documentation for accuracy and completeness, and I agree with the above.  SANDRIDGE,BRENDA K, PA-C

## 2024-08-28 NOTE — Progress Notes (Signed)
 New Patient Visit   Subjective  Alicia Buchanan is a 63 y.o. female NEW PATIENT who presents for the following: Psoriasis. Duration now x 15 + years. It has progressively gotten worse over the years and she is miserable. Affected areas include the scalp, elbows, and lower extremities. he has used TMC 0.5% cream in the past but it does not help.   Patient has known primary biliary cirrhosis (AKA: primary biliary cholangitis) so her PCP did not want to start her on any oral medications for her psoriasis. She does see GI and is currently on  Ursodiol and she states her condition is stable. She has also been diagnosed with Crohn's disease but patient reports she does not have any symptoms.     The following portions of the chart were reviewed this encounter and updated as appropriate: medications, allergies, medical history  Review of Systems:  No other skin or systemic complaints except as noted in HPI or Assessment and Plan.  Objective  Well appearing patient in no apparent distress; mood and affect are within normal limits.  Areas Examined: Full skin exam   Relevant exam findings are noted in the Assessment and Plan.    Assessment & Plan   TOBACCO USE - strongly recommend smoking cessation (as she is).    PSORIASIS Exam: Well-demarcated erythematous plaques with silvery scale, 30% BSA.  NOT AT GOAL and INADEQUATELY CONTROLLED  patient denies joint pain   Psoriasis is a chronic non-curable, but treatable genetic/hereditary disease that may have other systemic features affecting other organ systems such as joints (Psoriatic Arthritis). It is associated with an increased risk of inflammatory bowel disease, heart disease, non-alcoholic fatty liver disease, and depression.  Treatments include light and laser treatments; topical medications; and systemic medications including oral and injectables.  Treatment Plan: - topical steroids as below  - discussed tobacco is known to  exacerbate psoriasis  - I will discuss with her GI MD and see if he is okay with any systemic medication - although most are filtered through the liver, it might be that we can only treat her with topicals as to not further complicate her primary biliary cirrhosis (cholangitis).    PSORIASIS   Related Procedures Comprehensive metabolic panel CBC with Differential/Platelet Hepatitis B surface antibody,qualitative Hepatitis B surface antigen Hepatitis C antibody QuantiFERON-TB Gold Plus Hepatitis B surface antibody,quantitative TOBACCO USE    PSORIASIS Well-demarcated erythematous papules/plaques with silvery scale, guttate pink scaly papules. 30% BSA.   Patient denies joint pain  Treatment Plan: Clobetasol  ointment Apply to affected severe areas twice daily until clear  TMC 0.1% ointment Apply to less severe areas twice daily until clear.  Clobetasol  solution Apply to scalp twice daily as needed  Ketoconazole  2% shampoo Wash scalp daily as needed   Discussed biologic treatment. We will check her labs today and discuss biologics and he primary biliary cirrhosis with her PCP before deciding to start treatment.   Counseling on psoriasis and coordination of care  psoriasis is a chronic non-curable, but treatable genetic/hereditary disease that may have other systemic features affecting other organ systems such as joints (Psoriatic Arthritis). It is associated with an increased risk of inflammatory bowel disease, heart disease, non-alcoholic fatty liver disease, and depression.  Treatments include light and laser treatments; topical medications; and systemic medications including oral and injectables.    Return in about 4 months (around 12/26/2024) for Psoriasis.  I, Roseline Hutchinson, CMA, am acting as scribe for Randie Tallarico K, PA-C .   Documentation:  I have reviewed the above documentation for accuracy and completeness, and I agree with the above.  Xion Debruyne K,  PA-C

## 2024-08-28 NOTE — Patient Instructions (Signed)

## 2024-08-31 LAB — QUANTIFERON-TB GOLD PLUS
QuantiFERON Mitogen Value: 5.84 [IU]/mL
QuantiFERON Nil Value: 0 [IU]/mL
QuantiFERON TB1 Ag Value: 0.02 [IU]/mL
QuantiFERON TB2 Ag Value: 0.01 [IU]/mL
QuantiFERON-TB Gold Plus: NEGATIVE

## 2024-08-31 LAB — HEPATITIS B SURFACE ANTIBODY, QUANTITATIVE: Hepatitis B Surf Ab Quant: <3.5 m[IU]/mL — ABNORMAL LOW

## 2024-08-31 LAB — COMPREHENSIVE METABOLIC PANEL WITH GFR
ALT: 29 IU/L (ref 0–32)
AST: 25 IU/L (ref 0–40)
Albumin: 4.7 g/dL (ref 3.9–4.9)
Alkaline Phosphatase: 171 IU/L — ABNORMAL HIGH (ref 49–135)
BUN/Creatinine Ratio: 23 (ref 12–28)
BUN: 17 mg/dL (ref 8–27)
Bilirubin Total: 0.5 mg/dL (ref 0.0–1.2)
CO2: 22 mmol/L (ref 20–29)
Calcium: 10.2 mg/dL (ref 8.7–10.3)
Chloride: 99 mmol/L (ref 96–106)
Creatinine, Ser: 0.74 mg/dL (ref 0.57–1.00)
Globulin, Total: 3.1 g/dL (ref 1.5–4.5)
Glucose: 99 mg/dL (ref 70–99)
Potassium: 4.8 mmol/L (ref 3.5–5.2)
Sodium: 138 mmol/L (ref 134–144)
Total Protein: 7.8 g/dL (ref 6.0–8.5)
eGFR: 90 mL/min/1.73 (ref >59)

## 2024-08-31 LAB — CBC WITH DIFFERENTIAL/PLATELET
Basophils Absolute: 0 x10E3/uL (ref 0.0–0.2)
Basos: 1 %
EOS (ABSOLUTE): 0.3 x10E3/uL (ref 0.0–0.4)
Eos: 3 %
Hematocrit: 45.6 % (ref 34.0–46.6)
Hemoglobin: 14.6 g/dL (ref 11.1–15.9)
Immature Grans (Abs): 0 x10E3/uL (ref 0.0–0.1)
Immature Granulocytes: 0 %
Lymphocytes Absolute: 2.4 x10E3/uL (ref 0.7–3.1)
Lymphs: 30 %
MCH: 29.8 pg (ref 26.6–33.0)
MCHC: 32 g/dL (ref 31.5–35.7)
MCV: 93 fL (ref 79–97)
Monocytes Absolute: 0.5 x10E3/uL (ref 0.1–0.9)
Monocytes: 6 %
Neutrophils Absolute: 4.8 x10E3/uL (ref 1.4–7.0)
Neutrophils: 60 %
Platelets: 457 x10E3/uL — ABNORMAL HIGH (ref 150–450)
RBC: 4.9 x10E6/uL (ref 3.77–5.28)
RDW: 13.1 % (ref 11.7–15.4)
WBC: 8 x10E3/uL (ref 3.4–10.8)

## 2024-08-31 LAB — HEPATITIS C ANTIBODY: Hep C Virus Ab: NONREACTIVE

## 2024-08-31 LAB — HEPATITIS B SURFACE ANTIBODY,QUALITATIVE

## 2024-08-31 LAB — HEPATITIS B SURFACE ANTIGEN: Hepatitis B Surface Ag: NEGATIVE

## 2024-09-16 ENCOUNTER — Ambulatory Visit: Payer: Self-pay | Admitting: Physician Assistant

## 2024-09-16 ENCOUNTER — Encounter: Payer: Self-pay | Admitting: Physician Assistant

## 2024-11-22 ENCOUNTER — Encounter: Payer: Self-pay | Admitting: Physician Assistant

## 2025-01-02 ENCOUNTER — Ambulatory Visit: Admitting: Physician Assistant
